# Patient Record
Sex: Female | Born: 1980 | Race: White | Hispanic: No | Marital: Single | Smoking: Current every day smoker
Health system: Southern US, Community
[De-identification: ages and names within clinical notes are randomized; demographics above are authoritative.]

## PROBLEM LIST (undated history)

## (undated) DIAGNOSIS — R21 Rash and other nonspecific skin eruption: Secondary | ICD-10-CM

## (undated) DIAGNOSIS — F341 Dysthymic disorder: Secondary | ICD-10-CM

## (undated) DIAGNOSIS — F411 Generalized anxiety disorder: Secondary | ICD-10-CM

## (undated) DIAGNOSIS — L408 Other psoriasis: Secondary | ICD-10-CM

## (undated) DIAGNOSIS — G4700 Insomnia, unspecified: Secondary | ICD-10-CM

## (undated) DIAGNOSIS — F329 Major depressive disorder, single episode, unspecified: Secondary | ICD-10-CM

## (undated) DIAGNOSIS — R569 Unspecified convulsions: Secondary | ICD-10-CM

## (undated) HISTORY — PX: POLYPECTOMY: SHX149

## (undated) HISTORY — DX: Dysthymic disorder: F34.1

## (undated) HISTORY — DX: Other psoriasis: L40.8

## (undated) HISTORY — DX: Generalized anxiety disorder: F41.1

## (undated) HISTORY — DX: Insomnia, unspecified: G47.00

## (undated) HISTORY — DX: Major depressive disorder, single episode, unspecified: F32.9

## (undated) HISTORY — DX: Rash and other nonspecific skin eruption: R21

## (undated) HISTORY — PX: OVARIAN CYST REMOVAL: SHX89

---
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
## 2001-01-10 ENCOUNTER — Emergency Department (HOSPITAL_COMMUNITY): Admission: EM | Admit: 2001-01-10 | Discharge: 2001-01-10 | Payer: Self-pay | Admitting: Emergency Medicine

## 2002-08-17 ENCOUNTER — Emergency Department (HOSPITAL_COMMUNITY): Admission: EM | Admit: 2002-08-17 | Discharge: 2002-08-17 | Payer: Self-pay | Admitting: Emergency Medicine

## 2003-11-14 ENCOUNTER — Emergency Department (HOSPITAL_COMMUNITY): Admission: EM | Admit: 2003-11-14 | Discharge: 2003-11-14 | Payer: Self-pay | Admitting: Emergency Medicine

## 2005-03-07 ENCOUNTER — Emergency Department (HOSPITAL_COMMUNITY): Admission: EM | Admit: 2005-03-07 | Discharge: 2005-03-07 | Payer: Self-pay | Admitting: Emergency Medicine

## 2005-03-22 ENCOUNTER — Emergency Department (HOSPITAL_COMMUNITY): Admission: EM | Admit: 2005-03-22 | Discharge: 2005-03-22 | Payer: Self-pay | Admitting: Emergency Medicine

## 2006-07-31 ENCOUNTER — Emergency Department (HOSPITAL_COMMUNITY): Admission: EM | Admit: 2006-07-31 | Discharge: 2006-07-31 | Payer: Self-pay | Admitting: Emergency Medicine

## 2006-11-14 ENCOUNTER — Emergency Department (HOSPITAL_COMMUNITY): Admission: EM | Admit: 2006-11-14 | Discharge: 2006-11-14 | Payer: Self-pay | Admitting: Emergency Medicine

## 2007-03-09 ENCOUNTER — Emergency Department (HOSPITAL_COMMUNITY): Admission: EM | Admit: 2007-03-09 | Discharge: 2007-03-09 | Payer: Self-pay | Admitting: Emergency Medicine

## 2007-04-09 ENCOUNTER — Emergency Department (HOSPITAL_COMMUNITY): Admission: EM | Admit: 2007-04-09 | Discharge: 2007-04-09 | Payer: Self-pay | Admitting: Emergency Medicine

## 2008-01-11 HISTORY — PX: OTHER SURGICAL HISTORY: SHX169

## 2008-02-27 LAB — CONVERTED CEMR LAB: Pap Smear: NORMAL

## 2008-03-24 ENCOUNTER — Ambulatory Visit: Payer: Self-pay | Admitting: Internal Medicine

## 2008-03-24 ENCOUNTER — Telehealth: Payer: Self-pay | Admitting: Internal Medicine

## 2008-03-24 DIAGNOSIS — G4700 Insomnia, unspecified: Secondary | ICD-10-CM | POA: Insufficient documentation

## 2008-03-24 DIAGNOSIS — L408 Other psoriasis: Secondary | ICD-10-CM

## 2008-03-24 DIAGNOSIS — F341 Dysthymic disorder: Secondary | ICD-10-CM

## 2008-03-24 HISTORY — DX: Dysthymic disorder: F34.1

## 2008-03-24 HISTORY — DX: Insomnia, unspecified: G47.00

## 2008-03-24 HISTORY — DX: Other psoriasis: L40.8

## 2008-05-20 ENCOUNTER — Telehealth (INDEPENDENT_AMBULATORY_CARE_PROVIDER_SITE_OTHER): Payer: Self-pay | Admitting: *Deleted

## 2008-06-15 ENCOUNTER — Emergency Department (HOSPITAL_COMMUNITY): Admission: EM | Admit: 2008-06-15 | Discharge: 2008-06-15 | Payer: Self-pay | Admitting: Emergency Medicine

## 2008-08-12 ENCOUNTER — Encounter (INDEPENDENT_AMBULATORY_CARE_PROVIDER_SITE_OTHER): Payer: Self-pay | Admitting: Obstetrics and Gynecology

## 2008-08-12 ENCOUNTER — Ambulatory Visit (HOSPITAL_COMMUNITY): Admission: RE | Admit: 2008-08-12 | Discharge: 2008-08-12 | Payer: Self-pay | Admitting: Obstetrics and Gynecology

## 2008-09-09 ENCOUNTER — Telehealth: Payer: Self-pay | Admitting: Internal Medicine

## 2008-11-14 ENCOUNTER — Emergency Department (HOSPITAL_COMMUNITY): Admission: EM | Admit: 2008-11-14 | Discharge: 2008-11-15 | Payer: Self-pay | Admitting: Emergency Medicine

## 2008-12-08 ENCOUNTER — Telehealth: Payer: Self-pay | Admitting: Internal Medicine

## 2009-03-02 ENCOUNTER — Telehealth: Payer: Self-pay | Admitting: Internal Medicine

## 2009-03-10 LAB — CONVERTED CEMR LAB: Pap Smear: NORMAL

## 2009-05-20 ENCOUNTER — Ambulatory Visit: Payer: Self-pay | Admitting: Internal Medicine

## 2009-05-20 DIAGNOSIS — F329 Major depressive disorder, single episode, unspecified: Secondary | ICD-10-CM

## 2009-05-20 DIAGNOSIS — F411 Generalized anxiety disorder: Secondary | ICD-10-CM | POA: Insufficient documentation

## 2009-05-20 DIAGNOSIS — F3289 Other specified depressive episodes: Secondary | ICD-10-CM

## 2009-05-20 HISTORY — DX: Other specified depressive episodes: F32.89

## 2009-05-20 HISTORY — DX: Major depressive disorder, single episode, unspecified: F32.9

## 2009-05-20 HISTORY — DX: Generalized anxiety disorder: F41.1

## 2009-08-13 ENCOUNTER — Ambulatory Visit: Payer: Self-pay | Admitting: Internal Medicine

## 2009-08-13 DIAGNOSIS — R21 Rash and other nonspecific skin eruption: Secondary | ICD-10-CM

## 2009-08-13 HISTORY — DX: Rash and other nonspecific skin eruption: R21

## 2009-09-07 ENCOUNTER — Telehealth: Payer: Self-pay | Admitting: Internal Medicine

## 2009-12-14 ENCOUNTER — Telehealth: Payer: Self-pay | Admitting: Internal Medicine

## 2010-02-09 NOTE — Progress Notes (Signed)
Summary: medication refill  Phone Note Refill Request Message from:  Fax from Pharmacy on December 14, 2009 10:35 AM  Refills Requested: Medication #1:  ALPRAZOLAM 0.5 MG TABS 1po two times a day as needed   Dosage confirmed as above?Dosage Confirmed   Last Refilled: 09/07/2009   Notes: [facility] [phone] Initial call taken by: Robin Ewing CMA Duncan Dull),  December 14, 2009 10:35 AM    Prescriptions: ALPRAZOLAM 0.5 MG TABS (ALPRAZOLAM) 1po two times a day as needed  #60 x 3   Entered and Authorized by:   Corwin Levins MD   Signed by:   Corwin Levins MD on 12/14/2009   Method used:   Print then Give to Patient   RxID:   [id number]  done hardcopy to LIM side B - dahlia Corwin Levins MD  December 14, 2009 1:02 PM   Rx faxed to pharmacy Margaret Pyle, CMA  December 14, 2009 1:11 PM

## 2010-02-09 NOTE — Assessment & Plan Note (Signed)
Summary: fu---stc   Vital Signs:  Patient profile:   30 year old female Height:      64 inches Weight:      153 pounds BMI:     26.36 O2 Sat:      96 % on Room air Temp:     98.0 degrees F oral Pulse rate:   71 / minute BP sitting:   118 / 78  (left arm) Cuff size:   regular  Vitals Entered By: Bill Salinas CMA (May 20, 2009 8:15 AM)  O2 Flow:  Room air  Preventive Care Screening  Pap Smear:    Date:  03/10/2009    Results:  normal   CC: pt here for follow up, she states she is no longer taking sertraline/ ab   CC:  pt here for follow up and she states she is no longer taking sertraline/ ab.  History of Present Illness: overall doing well,  did not tolerate the setraline so had to stop after 2 months;  alprazolam does well but has to take 2, only takes a few times per wk with panic symptoms or trouble sleeping;  Pt denies CP, sob, doe, wheezing, orthopnea, pnd, worsening LE edema, palps, dizziness or syncope  Pt denies new neuro symptoms such as headache, facial or extremity weakness   No worsening depressive symptoms or suicidal ideation currently, although has had mld symptoms in the past yr..  Does not think counseling will help at all.    Has yearly pap and blood work with GYN - all normal recently per pt.   Does request cream for psoriasis that has been mild but incrased recently - did well with clobetasol per derm last yr.    Problems Prior to Update: 1)  Depression  (ICD-311) 2)  Anxiety  (ICD-300.00) 3)  Insomnia-sleep Disorder-unspec  (ICD-780.52) 4)  Depression/anxiety  (ICD-300.4) 5)  Psoriasis  (ICD-696.1)  Medications Prior to Update: 1)  Alprazolam 0.25 Mg Tabs (Alprazolam) .Marland Kitchen.. 1 By Mouth Two Times A Day As Needed 2)  Sertraline Hcl 100 Mg Tabs (Sertraline Hcl) .Marland Kitchen.. 1 and 1/2 By Mouth Once Daily  Current Medications (verified): 1)  Alprazolam 0.5 Mg Tabs (Alprazolam) .Marland Kitchen.. 1po Two Times A Day As Needed 2)  Clobetasol Propionate 0.05 % Crea (Clobetasol  Propionate) .... Use Asd Two Times A Day As Needed  Allergies (verified): 1)  * Sertaline  Past History:  Family History: Last updated: 03/24/2008 grandfather with DJD, defibrillator, DM mother with DM, anxiety father with depression  Social History: Last updated: 03/24/2008 Single no children work - Engineer, manufacturing systems - bartender Current Smoker Alcohol use-yes - rare  Risk Factors: Smoking Status: current (03/24/2008)  Past Medical History: psoriasis Anxiety Depression  Past Surgical History: Denies surgical history s/p uterine polyp may 2010   Review of Systems       all otherwise negative per pt -    Physical Exam  General:  alert and overweight-appearing - mild Head:  normocephalic and atraumatic.   Eyes:  vision grossly intact, pupils equal, and pupils round.   Ears:  R ear normal and L ear normal.   Nose:  no external deformity and no nasal discharge.   Mouth:  no gingival abnormalities and pharynx pink and moist.   Neck:  supple and no masses.   Lungs:  normal respiratory effort and normal breath sounds.   Heart:  normal rate and regular rhythm.   Abdomen:  soft, non-tender, and normal bowel sounds.   Msk:  no joint tenderness and no joint swelling.   Extremities:  no edema, no erythema  Skin:  mild psoriatic plaque to elbows and leg below the knees Psych:  not depressed appearing and moderately anxious.     Impression & Recommendations:  Problem # 1:  PSORIASIS (ICD-696.1) ok for clobetasol as needed   Problem # 2:  ANXIETY (ICD-300.00)  The following medications were removed from the medication list:    Sertraline Hcl 100 Mg Tabs (Sertraline hcl) .Marland Kitchen... 1 and 1/2 by mouth once daily Her updated medication list for this problem includes:    Alprazolam 0.5 Mg Tabs (Alprazolam) .Marland Kitchen... 1po two times a day as needed treat as above, f/u any worsening signs or symptoms   Problem # 3:  DEPRESSION (ICD-311)  The following medications were removed from  the medication list:    Sertraline Hcl 100 Mg Tabs (Sertraline hcl) .Marland Kitchen... 1 and 1/2 by mouth once daily Her updated medication list for this problem includes:    Alprazolam 0.5 Mg Tabs (Alprazolam) .Marland Kitchen... 1po two times a day as needed consdier wellbutrin, but stable at this time  Complete Medication List: 1)  Alprazolam 0.5 Mg Tabs (Alprazolam) .Marland Kitchen.. 1po two times a day as needed 2)  Clobetasol Propionate 0.05 % Crea (Clobetasol propionate) .... Use asd two times a day as needed  Patient Instructions: 1)  Continue all previous medications as before this visit  2)  Please schedule a follow-up appointment in 1 year or sooner if needed Prescriptions: CLOBETASOL PROPIONATE 0.05 % CREA (CLOBETASOL PROPIONATE) use asd two times a day as needed  #60 gm x 1   Entered and Authorized by:   Corwin Levins MD   Signed by:   Corwin Levins MD on 05/20/2009   Method used:   Print then Give to Patient   RxID:   [id number] ALPRAZOLAM 0.5 MG TABS (ALPRAZOLAM) 1po two times a day as needed  #60 x 3   Entered and Authorized by:   Corwin Levins MD   Signed by:   Corwin Levins MD on 05/20/2009   Method used:   Print then Give to Patient   RxID:   [id number]

## 2010-02-09 NOTE — Assessment & Plan Note (Signed)
Summary: rash lower leg,ankle/cd   Vital Signs:  Patient profile:   30 year old female Height:      64 inches Weight:      141.50 pounds BMI:     24.38 O2 Sat:      96 % on Room air Temp:     98 degrees F oral Pulse rate:   87 / minute BP sitting:   90 / 62  (left arm) Cuff size:   regular  Vitals Entered By: Zella Ball Ewing CMA Duncan Dull) (August 13, 2009 9:57 AM)  O2 Flow:  Room air CC: Rash on lower right leg,ankle/RE   CC:  Rash on lower right leg and ankle/RE.  History of Present Illness: here after seen at urgent care recently for area of rash (not much  better with her clobetasol she has for long term tx of her chronic psoriasis)  to the RLE involving the lateral /post heel and ankle which she was told was c/w ringworm, tx with oral fluconozole and ketoconoz cream which did not seem to help last wk;  became frustrated so she tried clorox topical on her own which only seemed to make the area worse with redness, itch and worsening swelling distally to the ankle and foot;  there is only minimjal pain and everything just seems to itch more.  Pt denies CP, sob, doe, wheezing, orthopnea, pnd, worsening LE edema, palps, dizziness or syncope  No tongue swelling or wheezing/sob.  No fever, wt loss, night sweats, loss of appetite or other constitutional symptoms   Has also had recurrent gingival isse with bleeding, seen per dental with cleaning and tx with antibx briefly which seemed to help, then gradually worsening again in the past 2 -3 days.   No fever, facial or sinus pain, neck pain or lumps.   Has ongoing stressors at home, but denies worsening depressive symptoms, suicidal ideaiton, panic or worsening anxiety.    Problems Prior to Update: 1)  Depression  (ICD-311) 2)  Anxiety  (ICD-300.00) 3)  Insomnia-sleep Disorder-unspec  (ICD-780.52) 4)  Depression/anxiety  (ICD-300.4) 5)  Psoriasis  (ICD-696.1)  Medications Prior to Update: 1)  Alprazolam 0.5 Mg Tabs (Alprazolam) .Marland Kitchen.. 1po Two  Times A Day As Needed 2)  Clobetasol Propionate 0.05 % Crea (Clobetasol Propionate) .... Use Asd Two Times A Day As Needed  Current Medications (verified): 1)  Alprazolam 0.5 Mg Tabs (Alprazolam) .Marland Kitchen.. 1po Two Times A Day As Needed 2)  Clobetasol Propionate 0.05 % Crea (Clobetasol Propionate) .... Use Asd Two Times A Day As Needed 3)  Prednisone 10 Mg Tabs (Prednisone) .... 3po Qd For 3days, Then 2po Qd For 3days, Then 1po Qd For 3days, Then Stop 4)  Lotrisone 1-0.05 % Crea (Clotrimazole-Betamethasone) .... Use Asd Two Times A Day As Needed 5)  Clindamycin Hcl 150 Mg Caps (Clindamycin Hcl) .Marland Kitchen.. 1 By Mouth Three Times A Day  Allergies (verified): 1)  * Sertaline  Past History:  Past Medical History: Last updated: 05/20/2009 psoriasis Anxiety Depression  Past Surgical History: Last updated: 05/20/2009 Denies surgical history s/p uterine polyp may 2010   Social History: Last updated: 03/24/2008 Single no children work - Engineer, manufacturing systems - bartender Current Smoker Alcohol use-yes - rare  Risk Factors: Smoking Status: current (03/24/2008)  Review of Systems       all otherwise negative per pt -    Physical Exam  General:  alert and well-developed.   Head:  normocephalic and atraumatic.   Eyes:  vision grossly intact,  pupils equal, and pupils round.   Ears:  R ear normal and L ear normal.   Nose:  no external deformity and no nasal discharge.   Mouth:  no gingival abnormalities and pharynx pink and moist.  except for left mild erythema/tender/sweling and mild bleeding noted Neck:  supple and no masses.   Lungs:  normal respiratory effort and normal breath sounds.   Heart:  normal rate and regular rhythm.   Extremities:  no edema, no erythema  Skin:  right lateral ankle and heel with approx 5x5 cm area erytheama with scaliness and slihgt swelling, nontender, but with more distall foot swelling without tender or erythema as well;  also with numerous psoriatic lesions to the  extremities and abd area as well Psych:  not anxious appearing and not depressed appearing.     Impression & Recommendations:  Problem # 1:  SKIN RASH (ICD-782.1)  to right ankle/heel with flare related to clorox on the "ringworm" - c/w contact dermatitis  - for depo today, as well as lotrisone topical asd   Orders: Depo- Medrol 40mg  (J1030) Depo- Medrol 80mg  (J1040) Admin of Therapeutic Inj  intramuscular or subcutaneous (45409) Dermatology Referral (Derma)  Her updated medication list for this problem includes:    Clobetasol Propionate 0.05 % Crea (Clobetasol propionate) ..... Use asd two times a day as needed    Lotrisone 1-0.05 % Crea (Clotrimazole-betamethasone) ..... Use asd two times a day as needed  Problem # 2:  PSORIASIS (ICD-696.1) also for clobetasol to cont long term as needed  Orders: Depo- Medrol 40mg  (J1030) Depo- Medrol 80mg  (J1040) Admin of Therapeutic Inj  intramuscular or subcutaneous (81191) Dermatology Referral (Derma)  Problem # 3:  DEPRESSION/ANXIETY (ICD-300.4) stable overall by hx and exam, ok to continue meds/tx as is - declines need for med trial such as ssri  Complete Medication List: 1)  Alprazolam 0.5 Mg Tabs (Alprazolam) .Marland Kitchen.. 1po two times a day as needed 2)  Clobetasol Propionate 0.05 % Crea (Clobetasol propionate) .... Use asd two times a day as needed 3)  Prednisone 10 Mg Tabs (Prednisone) .... 3po qd for 3days, then 2po qd for 3days, then 1po qd for 3days, then stop 4)  Lotrisone 1-0.05 % Crea (Clotrimazole-betamethasone) .... Use asd two times a day as needed 5)  Clindamycin Hcl 150 Mg Caps (Clindamycin hcl) .Marland Kitchen.. 1 by mouth three times a day  Patient Instructions: 1)  you had the steroid shot today 2)  Please take all new medications as prescribed - the prednisone, and the generic lotrisone cream until the rash is healed; as well as the cleocin for the mouth infection  3)  Continue all previous medications as before this visit , except you  can stop the ketoconozole cream , and diflucan 4)  You can continue the clobetasol long term for the psoriasis 5)  You will be contacted about the referral(s) to: Dermatology 6)  Please schedule a follow-up appointment as needed. Prescriptions: CLINDAMYCIN HCL 150 MG CAPS (CLINDAMYCIN HCL) 1 by mouth three times a day  #10 x 0   Entered and Authorized by:   Corwin Levins MD   Signed by:   Corwin Levins MD on 08/13/2009   Method used:   Print then Give to Patient   RxID:   [id number] LOTRISONE 1-0.05 % CREA (CLOTRIMAZOLE-BETAMETHASONE) use asd two times a day as needed  #1 x 1   Entered and Authorized by:   Corwin Levins MD   Signed by:  Corwin Levins MD on 08/13/2009   Method used:   Print then Give to Patient   RxID:   [id number] PREDNISONE 10 MG TABS (PREDNISONE) 3po qd for 3days, then 2po qd for 3days, then 1po qd for 3days, then stop  #18 x 0   Entered and Authorized by:   Corwin Levins MD   Signed by:   Corwin Levins MD on 08/13/2009   Method used:   Print then Give to Patient   RxID:   [id number]    Medication Administration  Injection # 1:    Medication: Depo- Medrol 40mg     Diagnosis: PSORIASIS (ICD-696.1)    Route: IM    Site: LUOQ gluteus    Exp Date: 04/2012    Lot #: [id number]    Mfr: Pharmacia    Comments: Patient received 120mg  Depo-medrol    Patient tolerated injection without complications    Given by: Zella Ball Ewing CMA (AAMA) (August 13, 2009 11:05 AM)  Injection # 2:    Medication: Depo- Medrol 80mg     Diagnosis: PSORIASIS (ICD-696.1)    Route: IM    Site: LUOQ gluteus    Exp Date: 04/2012    Lot #: [id number]    Mfr: Pharmacia    Given by: Zella Ball Ewing CMA (AAMA) (August 13, 2009 11:05 AM)  Orders Added: 1)  Depo- Medrol 40mg  [J1030] 2)  Depo- Medrol 80mg  [J1040] 3)  Admin of Therapeutic Inj  intramuscular or subcutaneous [96372] 4)  Dermatology Referral [Derma] 5)  Est. Patient Level IV [84696]

## 2010-02-09 NOTE — Progress Notes (Signed)
Summary: Alprazolam  Phone Note Refill Request Message from:  Fax from Pharmacy on March 02, 2009 9:20 AM  Refills Requested: Medication #1:  ALPRAZOLAM 0.25 MG TABS 1 by mouth two times a day as needed Next Appointment Scheduled: none Initial call taken by: Lucious Groves,  March 02, 2009 9:20 AM  Follow-up for Phone Call        may give #60 with 1 refill - needs to make OV before any other refills given  - thanks Follow-up by: Newt Lukes MD,  March 02, 2009 9:34 AM    Prescriptions: ALPRAZOLAM 0.25 MG TABS (ALPRAZOLAM) 1 by mouth two times a day as needed  #60 x 1   Entered by:   Lucious Groves   Authorized by:   Newt Lukes MD   Signed by:   Lucious Groves on 03/02/2009   Method used:   Telephoned to ...       [facility]  [phone]* (retail)       [address] [place], [place]  [address]       Ph: [phone] or [phone]       Fax: [phone]   RxID:   [id number]

## 2010-02-09 NOTE — Progress Notes (Signed)
Summary: medication refill  Phone Note Refill Request Message from:  Fax from Pharmacy on September 07, 2009 10:29 AM  Refills Requested: Medication #1:  ALPRAZOLAM 0.5 MG TABS 1po two times a day as needed   Dosage confirmed as above?Dosage Confirmed   Last Refilled: 05/20/2009   Notes: [facility] [phone] Initial call taken by: Robin Ewing CMA Duncan Dull),  September 07, 2009 10:30 AM    Prescriptions: ALPRAZOLAM 0.5 MG TABS (ALPRAZOLAM) 1po two times a day as needed  #60 x 3   Entered and Authorized by:   Corwin Levins MD   Signed by:   Corwin Levins MD on 09/07/2009   Method used:   Print then Give to Patient   RxID:   [id number]  done hardcopy to LIM side B - dahlia Corwin Levins MD  September 07, 2009 1:27 PM   Appended Document: medication refill faxed hardcopy to pharmacy

## 2010-04-05 ENCOUNTER — Other Ambulatory Visit: Payer: Self-pay | Admitting: Obstetrics and Gynecology

## 2010-04-14 LAB — COMPREHENSIVE METABOLIC PANEL
ALT: 22 U/L (ref 0–35)
AST: 27 U/L (ref 0–37)
Albumin: 4.3 g/dL (ref 3.5–5.2)
Alkaline Phosphatase: 69 U/L (ref 39–117)
BUN: 10 mg/dL (ref 6–23)
CO2: 24 mEq/L (ref 19–32)
Calcium: 8.6 mg/dL (ref 8.4–10.5)
Chloride: 106 mEq/L (ref 96–112)
Creatinine, Ser: 0.65 mg/dL (ref 0.4–1.2)
GFR calc Af Amer: >60 mL/min (ref >60)
GFR calc non Af Amer: >60 mL/min (ref >60)
Glucose, Bld: 82 mg/dL (ref 70–99)
Potassium: 4 mEq/L (ref 3.5–5.1)
Sodium: 139 mEq/L (ref 135–145)
Total Bilirubin: 0.5 mg/dL (ref 0.3–1.2)
Total Protein: 7 g/dL (ref 6.0–8.3)

## 2010-04-14 LAB — URINALYSIS, ROUTINE W REFLEX MICROSCOPIC
Bilirubin Urine: NEGATIVE
Glucose, UA: NEGATIVE mg/dL
Hgb urine dipstick: NEGATIVE
Ketones, ur: NEGATIVE mg/dL
Nitrite: NEGATIVE
Protein, ur: NEGATIVE mg/dL
Specific Gravity, Urine: 1.008 (ref 1.005–1.030)
Urobilinogen, UA: 0.2 mg/dL (ref 0.0–1.0)
pH: 7 (ref 5.0–8.0)

## 2010-04-14 LAB — CBC
HCT: 43.5 % (ref 36.0–46.0)
Hemoglobin: 14.9 g/dL (ref 12.0–15.0)
MCHC: 34.2 g/dL (ref 30.0–36.0)
MCV: 95.4 fL (ref 78.0–100.0)
Platelets: 326 10*3/uL (ref 150–400)
RBC: 4.55 MIL/uL (ref 3.87–5.11)
RDW: 12.3 % (ref 11.5–15.5)
WBC: 10.8 10*3/uL — ABNORMAL HIGH (ref 4.0–10.5)

## 2010-04-14 LAB — DIFFERENTIAL
Basophils Absolute: 0.2 10*3/uL — ABNORMAL HIGH (ref 0.0–0.1)
Basophils Relative: 2 % — ABNORMAL HIGH (ref 0–1)
Eosinophils Absolute: 0.1 10*3/uL (ref 0.0–0.7)
Eosinophils Relative: 1 % (ref 0–5)
Lymphocytes Relative: 22 % (ref 12–46)
Lymphs Abs: 2.4 10*3/uL (ref 0.7–4.0)
Monocytes Absolute: 0.6 10*3/uL (ref 0.1–1.0)
Monocytes Relative: 5 % (ref 3–12)
Neutro Abs: 7.4 10*3/uL (ref 1.7–7.7)
Neutrophils Relative %: 70 % (ref 43–77)

## 2010-04-14 LAB — PREGNANCY, URINE: Preg Test, Ur: NEGATIVE

## 2010-04-14 LAB — RAPID URINE DRUG SCREEN, HOSP PERFORMED
Amphetamines: NOT DETECTED
Barbiturates: NOT DETECTED
Benzodiazepines: POSITIVE — AB
Cocaine: NOT DETECTED
Opiates: NOT DETECTED
Tetrahydrocannabinol: POSITIVE — AB

## 2010-04-14 LAB — ACETAMINOPHEN LEVEL: Acetaminophen (Tylenol), Serum: <10 ug/mL — ABNORMAL LOW (ref 10–30)

## 2010-04-14 LAB — ETHANOL: Alcohol, Ethyl (B): 177 mg/dL — ABNORMAL HIGH (ref 0–10)

## 2010-04-14 LAB — SALICYLATE LEVEL: Salicylate Lvl: <4 mg/dL (ref 2.8–20.0)

## 2010-04-17 LAB — CBC
HCT: 41.3 % (ref 36.0–46.0)
Hemoglobin: 14.1 g/dL (ref 12.0–15.0)
MCHC: 34.2 g/dL (ref 30.0–36.0)
MCV: 96.8 fL (ref 78.0–100.0)
Platelets: 234 10*3/uL (ref 150–400)
RBC: 4.27 MIL/uL (ref 3.87–5.11)
RDW: 12.9 % (ref 11.5–15.5)
WBC: 10.3 10*3/uL (ref 4.0–10.5)

## 2010-04-17 LAB — HCG, QUANTITATIVE, PREGNANCY: hCG, Beta Chain, Quant, S: <2 m[IU]/mL (ref <5)

## 2010-04-19 LAB — URINALYSIS, ROUTINE W REFLEX MICROSCOPIC
Glucose, UA: NEGATIVE mg/dL
Ketones, ur: NEGATIVE mg/dL
Leukocytes, UA: NEGATIVE
Protein, ur: NEGATIVE mg/dL

## 2010-04-19 LAB — URINE CULTURE: Colony Count: NO GROWTH

## 2010-04-19 LAB — CBC
HCT: 41.4 % (ref 36.0–46.0)
Hemoglobin: 14.2 g/dL (ref 12.0–15.0)
MCHC: 34.2 g/dL (ref 30.0–36.0)
MCV: 93.6 fL (ref 78.0–100.0)
Platelets: 241 10*3/uL (ref 150–400)
RBC: 4.43 MIL/uL (ref 3.87–5.11)
RDW: 13 % (ref 11.5–15.5)
WBC: 8.5 10*3/uL (ref 4.0–10.5)

## 2010-04-19 LAB — DIFFERENTIAL
Basophils Absolute: 0 10*3/uL (ref 0.0–0.1)
Basophils Relative: 1 % (ref 0–1)
Eosinophils Absolute: 0.1 10*3/uL (ref 0.0–0.7)
Eosinophils Relative: 1 % (ref 0–5)
Lymphocytes Relative: 30 % (ref 12–46)
Lymphs Abs: 2.5 10*3/uL (ref 0.7–4.0)
Monocytes Absolute: 0.5 10*3/uL (ref 0.1–1.0)
Monocytes Relative: 6 % (ref 3–12)
Neutro Abs: 5.3 10*3/uL (ref 1.7–7.7)
Neutrophils Relative %: 63 % (ref 43–77)

## 2010-04-19 LAB — POCT I-STAT, CHEM 8
BUN: 5 mg/dL — ABNORMAL LOW (ref 6–23)
Calcium, Ion: 1.08 mmol/L — ABNORMAL LOW (ref 1.12–1.32)
Chloride: 104 mEq/L (ref 96–112)
Creatinine, Ser: 0.6 mg/dL (ref 0.4–1.2)
Glucose, Bld: 164 mg/dL — ABNORMAL HIGH (ref 70–99)
HCT: 42 % (ref 36.0–46.0)
Hemoglobin: 14.3 g/dL (ref 12.0–15.0)
Potassium: 3.9 mEq/L (ref 3.5–5.1)
Sodium: 138 mEq/L (ref 135–145)
TCO2: 23 mmol/L (ref 0–100)

## 2010-04-19 LAB — WET PREP, GENITAL
Clue Cells Wet Prep HPF POC: NONE SEEN
Yeast Wet Prep HPF POC: NONE SEEN

## 2010-04-19 LAB — GC/CHLAMYDIA PROBE AMP, GENITAL: GC Probe Amp, Genital: NEGATIVE

## 2010-04-19 LAB — POCT PREGNANCY, URINE: Preg Test, Ur: NEGATIVE

## 2010-04-28 ENCOUNTER — Other Ambulatory Visit: Payer: Self-pay

## 2010-04-28 MED ORDER — ALPRAZOLAM 0.5 MG PO TABS: 0.5000 mg | ORAL_TABLET | Freq: Two times a day (BID) | ORAL | Status: AC | PRN

## 2010-04-28 NOTE — Telephone Encounter (Signed)
Done hardcopy to dahlia/LIM B  

## 2010-04-28 NOTE — Telephone Encounter (Signed)
Rx faxed to pharmacy  

## 2010-04-28 NOTE — Telephone Encounter (Signed)
[facility] requesting refill on Alprazolam 0.5 mg last refill 12/14/09 #60 with 3 refills and last OV 08/13/09.

## 2010-05-25 NOTE — H&P (Signed)
NAMECLEMENTINA, MARENO          ACCOUNT NO.:  [id number]   MEDICAL RECORD NO.:  [id number]          PATIENT TYPE:  AMB   LOCATION:  [facility]                           FACILITY:  [facility]   PHYSICIAN:  Naima A. Dillard, M.D. DATE OF BIRTH:  02/24/1980   DATE OF ADMISSION:  DATE OF DISCHARGE:                              HISTORY & PHYSICAL   HISTORY OF PRESENT ILLNESS:  Ms. Skillin is a 30 year old single white  female gravida 0, presenting for a diagnostic laparoscopy, hysteroscopy,  D and C with polypectomy because of chronic pelvic pain, irregular  vaginal bleeding and an endometrial polyp.  Since April 2010, the  patient reports spotting throughout the entire month along with her  monthly menstrual flow which lasts approximately 4 days.  Since that  time, she has had with this spotting constant cramping on a daily basis  which she rates as a 6-8/10 on a 10-point pain scale.  The patient has  not found any relief from this cramping with over-the-counter analgesia  nor with narcotic (Darvocet) pain medication.  She denies any urinary  tract symptoms, any changes in her bowel habits, vaginitis symptoms or  fever.  She does admit, however, to episodes of diarrhea which have been  going on for an extended period of time, during which time she will pass  4 loose stools 4 times a day whenever these episodes occur.  The  pelvic ultrasound done on July 09, 2008, showed a uterus measuring 6.89  cm x 3.95 cm x 2.99 cm with a hyperechoic mass in the endometrium  measuring 0.84 x 0.57 with positive single blood flow with color flow  Doppler suggestive of a polyp.  The patient's ovaries appeared within  normal range on this study.  Given patient's symptoms of irregular  bleeding and chronic pelvic pain, she has decided to proceed with the  aforementioned procedures.   PAST MEDICAL HISTORY:   OBSTETRIC HISTORY:  Gravida 0.   GYNECOLOGIC HISTORY:  Menarche 30 years old.  Last menstrual period, see  history of present illness.  The patient does not use any method of  contraception.  Denies any history of sexually transmitted diseases, has  a history of an abnormal Pap smear in 2004, for which she underwent  colposcopy (results are not available).  The patient had a normal Pap,  however, in February 2010.   MEDICAL HISTORY:  Positive for psoriasis, anxiety, depression and  asthma.   SURGERY:  Negative.  Denies any history of blood transfusion.   FAMILY HISTORY:  Positive for anxiety, cardiovascular disease, diabetes,  and skin cancer.   HABITS:  The patient smokes one-half pack of cigarettes per day,  occasionally consumes alcohol, denies illicit drug use.   SOCIAL HISTORY:  The patient is single and she works as a Production assistant, radio at  [employer].   CURRENT MEDICATIONS:  1. Alprazolam 0.25 mg daily as needed.  2. Sertraline 100 mg daily.   ALLERGIES:  The patient has no known drug allergies.   REVIEW OF SYSTEMS:  The patient denies any chest pain, shortness of  breath, headache, vision changes, nausea, vomiting, joint swelling or  pain and except as is mentioned in history of present illness, the  patient's review of systems is otherwise negative.   PHYSICAL EXAMINATION:  VITAL SIGNS:  Blood pressure is 110/80, weight  138 pounds, height 5 feet 4-1/2 inches tall.  EARS, NOSE AND THROAT:  Pupils are equal.  Hearing normal.  Throat is  clear.  NECK:  Supple without masses.  There is no thyromegaly or cervical  adenopathy.  HEART:  Regular rate and rhythm.  LUNGS:  Clear.  BACK:  No CVA tenderness.  ABDOMEN:  No tenderness, masses or organomegaly.  EXTREMITIES:  No clubbing, cyanosis or edema.  PELVIC:  EGBUS is normal.  Vagina is normal.  Cervix is nontender  without lesions.  Uterus appears normal size, shape and consistency;  however, there is mild tenderness.  Adnexa, no tenderness or masses.   IMPRESSION:  1. Chronic pelvic pain.  2. Irregular vaginal bleeding.  3.  Endometrial polyps.   DISPOSITION:  A discussion was held with the patient regarding the  indications for her procedures along with their risks which include, but  are not limited to reaction to anesthesia, damage to adjacent organs,  infection and excessive bleeding.  The patient verbalized understanding  of these risks and has consented to proceed with a diagnostic  laparoscopy, hysteroscopy with D and C/polypectomy at [facility] of  [place] on August 12, 2008, at 11 o'clock a.m.      [place] J. Adline Peals.      Naima A. Normand Sloop, M.D.  Electronically Signed    EJP/MEDQ  D:  08/04/2008  T:  08/05/2008  Job:  [id number]

## 2010-05-25 NOTE — Op Note (Signed)
NAMEIDONA, STACH          ACCOUNT NO.:  [id number]   MEDICAL RECORD NO.:  [id number]          PATIENT TYPE:  AMB   LOCATION:  SDC                           FACILITY:  [facility]   PHYSICIAN:  Naima A. Dillard, M.D. DATE OF BIRTH:  1980-03-19   DATE OF PROCEDURE:  08/12/2008  DATE OF DISCHARGE:  08/12/2008                               OPERATIVE REPORT   PREOPERATIVE DIAGNOSES:  Chronic pelvic pain, irregular vaginal  bleeding, endometrial polyps.   POSTOPERATIVE DIAGNOSES:  Chronic pelvic pain, irregular vaginal  bleeding, endometrial polyps with a right ovarian cysts.   PROCEDURE:  Hysteroscopy, dilation and curettage with resection of  endometrial polyp, diagnostic laparoscopy with ovarian cystectomy.   SURGEON:  Naima A. Normand Sloop, MD   ASSISTANT:  Marquis Lunch. Lowell Guitar, PA.   ANESTHESIA:  General.   FINDINGS:  A 2-3 endometrial polyps in the anterior uterine fundus.  No  submucosal fibroids were seen.  Both ostia were visualized.  Both polyps  and endometrial curettings was sent to pathology.   FINDINGS:  On the laparoscopy, there was about 2-3 cm size right ovarian  cyst, normal-appearing uterus, tubes, anterior and posterior cul-de-sac,  normal-appearing abdominal anatomy as bowel and liver.   ESTIMATED BLOOD LOSS:  Minimal.   IV FLUIDS:  1400 mL.   URINE OUTPUT:  200 mL.  The patient went to PACU in stable condition.   PROCEDURE IN DETAIL:  The patient was taken to the operating room where  she was given general anesthesia placed in dorsal lithotomy position and  prepped and draped in a normal sterile fashion.  A bivalve speculum was  placed into the vagina.  Anterior lip of the cervix was grasped with  single-tooth tenaculum.  Cervix was dilated with Pratt dilators to 21.  The diagnostic hysteroscope was placed into the uterine cavity and 2  polyps were seen in the anterior uterine wall.  The hysteroscope was  removed.  The cervix was further dilated with Pratt  dilators up to 32.  The resectoscope was placed into the uterine cavity and actually the  forceps was placed up with the polyp forceps and it some polyps were  removed with polyp forceps.  Once polyp was removed with the polyp  forceps and another just remained so that was resected with a  resectoscope.  Then, a sharp curettage was done.  Endometrial curette  and polyps was sent to pathology.  To look back in with a diagnostic  scope both polyps had been sessile removed.  A acorn manipulator was  then attached to the cervix and was placed in the cervical canal for  uterine manipulator.  Attention was then turned to the abdomen are  closed with change and 2-mm infraumbilical incision was made with the  scalpel and carried down to the fascia using the knife.  The fascia was  then incised in the midline and extended bilaterally using Mayo  scissors.  Peritoneum was identified, tented up, and sharply with  Metzenbaum scissors.  Hasson pursestring suture was placed around the  fascia.  The Hasson was placed and anchored to the pursestring suture.  The abdomen  was insufflated with CO2 gas.  A polyp as noted above was  seen.  Two left lower quadrant port and right lower quadrant port was  placed with using Marcaine first under direct visualization of  laparoscope.  The patient's right ovary was then grasped and lifted up.  The bipolar needle was used to dissect along the cyst wall.  The cyst  was then opened.  The cyst wall was removed and the bleeding areas were  made hemostatic with Kleppingers.  irrigation was then done and the  abdomen and cyst and the CO2 gas was allowed to leave the abdomen twice  and hemostasis was noted.  The trocars were removed under direct  visualization without any bleeding.  The umbilical trocar was removed  under direct visualization.  The pursestring suture was tied to  approximate the fascia.  The infraumbilical incision was closed with 0  Monocryl in a  subcuticular fashion.  The two right and left lower  quadrant ports were made hemostatic with Dermabond.  Foley catheter was  placed before we start the laparoscopy.  Both the Foley catheter and the  tenaculum and the acorn was removed.  Hemostasis was noted.  Sponge,  lap, and needle counts were correct.  The patient taken to recovery room  in stable condition.      Naima A. Normand Sloop, M.D.  Electronically Signed     NAD/MEDQ  D:  08/12/2008  T:  08/13/2008  Job:  [id number]

## 2010-05-28 NOTE — Discharge Summary (Signed)
[facility]  Patient:    Katelyn Bridges, Katelyn Bridges                 MRN: [id number] Adm. Date:  [id number] Disc. Date: [id number] Attending:  Odie Sera                           Discharge Summary  HISTORY OF PRESENT ILLNESS:  [name] is a young lady who is only 34.  She had an argument with her mother and made some statements about harming herself and as hospitalized here.  When evaluated, she did acknowledge marijuana abuse.  Urine drug screen was positive only for Benadryl and cocaine.  At any rate, the patient was evaluated by myself and Eduard Roux, nurse practitioner, and found not be dangerous or suicidal and able to contract for safety. So, she was discharged home with family session to be scheduled as well as IOP program to further address social issues.  DISCHARGE INSTRUCTIONS:  As mentioned, go to IOP and have family session.  FINAL DIAGNOSES:   AXIS I:  1. Major depression, recurrent, nonsuicidal.            2. Marijuana abuse.            3. Rule out cocaine abuse.  AXIS II:  Deferred. AXIS III:  Deferred.DD:  05/10/99 TD:  05/09/99 Job: [id number]

## 2010-06-14 ENCOUNTER — Encounter: Payer: Self-pay | Admitting: Internal Medicine

## 2010-06-14 DIAGNOSIS — Z0000 Encounter for general adult medical examination without abnormal findings: Secondary | ICD-10-CM | POA: Insufficient documentation

## 2010-06-15 ENCOUNTER — Other Ambulatory Visit (INDEPENDENT_AMBULATORY_CARE_PROVIDER_SITE_OTHER): Payer: BC Managed Care – PPO

## 2010-06-15 ENCOUNTER — Encounter: Payer: Self-pay | Admitting: Internal Medicine

## 2010-06-15 ENCOUNTER — Ambulatory Visit (INDEPENDENT_AMBULATORY_CARE_PROVIDER_SITE_OTHER): Payer: BC Managed Care – PPO | Admitting: Internal Medicine

## 2010-06-15 VITALS — BP 120/82 | HR 78 | Temp 97.6°F | Ht 65.0 in | Wt 138.0 lb

## 2010-06-15 DIAGNOSIS — R21 Rash and other nonspecific skin eruption: Secondary | ICD-10-CM

## 2010-06-15 DIAGNOSIS — R197 Diarrhea, unspecified: Secondary | ICD-10-CM

## 2010-06-15 DIAGNOSIS — F411 Generalized anxiety disorder: Secondary | ICD-10-CM

## 2010-06-15 LAB — CBC WITH DIFFERENTIAL/PLATELET
Basophils Absolute: 0 10*3/uL (ref 0.0–0.1)
Basophils Relative: 0.6 % (ref 0.0–3.0)
Eosinophils Absolute: 0.2 10*3/uL (ref 0.0–0.7)
Lymphocytes Relative: 41.8 % (ref 12.0–46.0)
MCHC: 34.4 g/dL (ref 30.0–36.0)
Neutrophils Relative %: 47.2 % (ref 43.0–77.0)
Platelets: 274 10*3/uL (ref 150.0–400.0)
RBC: 4.19 Mil/uL (ref 3.87–5.11)
RDW: 13.6 % (ref 11.5–14.6)

## 2010-06-15 LAB — BASIC METABOLIC PANEL
BUN: 8 mg/dL (ref 6–23)
Chloride: 110 mEq/L (ref 96–112)
Glucose, Bld: 92 mg/dL (ref 70–99)
Potassium: 4.2 mEq/L (ref 3.5–5.1)
Sodium: 143 mEq/L (ref 135–145)

## 2010-06-15 LAB — TSH: TSH: 1.63 u[IU]/mL (ref 0.35–5.50)

## 2010-06-15 LAB — HEPATIC FUNCTION PANEL
Bilirubin, Direct: 0.1 mg/dL (ref 0.0–0.3)
Total Bilirubin: 0.4 mg/dL (ref 0.3–1.2)

## 2010-06-15 MED ORDER — ADALIMUMAB 40 MG/0.8ML ~~LOC~~ KIT: 40.0000 mg | PACK | SUBCUTANEOUS | Status: DC

## 2010-06-15 MED ORDER — TRIAMCINOLONE ACETONIDE 0.1 % EX CREA: TOPICAL_CREAM | Freq: Two times a day (BID) | CUTANEOUS | Status: DC

## 2010-06-15 NOTE — Progress Notes (Signed)
Quick Note:  Voice message left on PhoneTree system - lab is negative, normal or otherwise stable, pt to continue same tx ______ 

## 2010-06-15 NOTE — Patient Instructions (Signed)
Take all new medications as prescribed - the cream Continue all other medications as before Please go to LAB in the Basement for the blood and/or urine tests to be done today (and stool tests) Please call the phone number [phone] (the [employer]Tree System) for results of testing in 2-3 days;  When calling, simply dial the number, and when prompted enter the MRN number above (the Medical Record Number) and the # key, then the message should start. You may wish to followup with your dermatologist if the rash persists, with the idea of Humira causing the rash, or for skin biopsy You may also wish to see GI if the diarrhea persists as well;  Please call if fever, pain or blood

## 2010-06-15 NOTE — Assessment & Plan Note (Signed)
Exam benign, afeb, check wbc, cont humira for now, check stool cultures,  to f/u any worsening symptoms or concerns

## 2010-06-15 NOTE — Progress Notes (Signed)
Subjective:    Patient ID: Katelyn Bridges, female    DOB: 1980-02-23, 30 y.o.   MRN: [id number]  HPI30 yo F with psoriasis on humira per derm, here with 1 wk hx of nonpruritic rash to legs below the knees, no pain, and otherwise functinally normal;  No fever, sweling other rash or redness, red streaks or hx of same in the past, but does have a sort of mild burning warm discomfort.  Has been on humira some time without rash or allergy.  HIV tested neg march 2012 per pt.  On no new meds or OTC's.  No overt bleeding or bruising;  Has also had unsual recurrent watery and loose stools almost dialy for approx 4 wks.  Denies worsening reflux, dysphagia, abd pain, n/v, other bowel change or blood.  Pt denies fever, wt loss, night sweats, loss of appetite, or other constitutional symptoms  Pt denies chest pain, increased sob or doe, wheezing, orthopnea, PND, increased LE swelling, palpitations, dizziness or syncope.  Pt denies new neurological symptoms such as new headache, or facial or extremity weakness or numbness   Pt denies polydipsia, polyuria. Past Medical History  Diagnosis Date  . ANXIETY 05/20/2009  . DEPRESSION/ANXIETY 03/24/2008  . DEPRESSION 05/20/2009  . INSOMNIA-SLEEP DISORDER-UNSPEC 03/24/2008  . PSORIASIS 03/24/2008  . SKIN RASH 08/13/2009   Past Surgical History  Procedure Date  . S/p uterine polyp 2010    reports that she has been smoking.  She does not have any smokeless tobacco history on file. She reports that she drinks alcohol. Her drug history not on file. family history includes Anxiety disorder in her mother; Depression in her father; and Diabetes in her mother and other. No Known Allergies Current Outpatient Prescriptions on File Prior to Visit  Medication Sig Dispense Refill  . ALPRAZolam (XANAX) 0.5 MG tablet Take 0.5 mg by mouth 2 (two) times daily as needed.        . clotrimazole-betamethasone (LOTRISONE) cream Apply topically 2 (two) times daily.        Marland Kitchen DISCONTD:  clobetasol (TEMOVATE) 0.05 % cream Apply topically 2 (two) times daily.        Marland Kitchen DISCONTD: clindamycin (CLEOCIN) 150 MG capsule Take 150 mg by mouth 3 (three) times daily.        Marland Kitchen DISCONTD: predniSONE (DELTASONE) 10 MG tablet 3 by mouth every day for 3 days then 2 by mouth every day for 3 days then 1 by mouth for 3 days then stop.        Review of Systems All otherwise neg per pt     Objective:   Physical Exam BP 120/82  Pulse 78  Temp(Src) 97.6 F (36.4 C) (Oral)  Ht 5\' 5"  (1.651 m)  Wt 138 lb (62.596 kg)  BMI 22.96 kg/m2  SpO2 95%  LMP 06/15/2010 Physical Exam  VS noted Constitutional: Pt appears well-developed and well-nourished.  HENT: Head: Normocephalic.  Right Ear: External ear normal.  Left Ear: External ear normal.  Eyes: Conjunctivae and EOM are normal. Pupils are equal, round, and reactive to light.  Neck: Normal range of motion. Neck supple.  Cardiovascular: Normal rate and regular rhythm.   Pulmonary/Chest: Effort normal and breath sounds normal.  Abd:  Soft, NT, non-distended, + BS Neurological: Pt is alert. No cranial nerve deficit.  Skin: Skin is warm. No erythema. except for diffuse TNTC small red lesions nontender, nonprurutic non swollen to legs below the knees to ankles Psychiatric: Pt behavior is normal. Thought content normal.  Assessment & Plan:

## 2010-06-15 NOTE — Assessment & Plan Note (Signed)
Etiology unclear - for esr, anca  , consider derm referral for biopsy, for now tx with topical triam cream prn trial

## 2010-06-15 NOTE — Assessment & Plan Note (Signed)
stable overall by hx and exam, most recent data reviewed with pt, and pt to continue medical treatment as before  Lab Results  Component Value Date   WBC 8.5 06/15/2010   HGB 13.9 06/15/2010   HCT 40.4 06/15/2010   PLT 274.0 06/15/2010   ALT 22 06/15/2010   AST 25 06/15/2010   NA 143 06/15/2010   K 4.2 06/15/2010   CL 110 06/15/2010   CREATININE 0.5 06/15/2010   BUN 8 06/15/2010   CO2 26 06/15/2010   TSH 1.63 06/15/2010

## 2010-08-23 ENCOUNTER — Other Ambulatory Visit: Payer: Self-pay

## 2010-08-23 MED ORDER — ALPRAZOLAM 0.5 MG PO TABS: 0.5000 mg | ORAL_TABLET | Freq: Two times a day (BID) | ORAL | Status: DC | PRN

## 2010-08-23 NOTE — Telephone Encounter (Signed)
Faxed hardcopy to [facility] [phone]

## 2010-08-24 ENCOUNTER — Other Ambulatory Visit: Payer: Self-pay | Admitting: *Deleted

## 2010-08-24 MED ORDER — ALPRAZOLAM 0.5 MG PO TABS: 0.5000 mg | ORAL_TABLET | Freq: Two times a day (BID) | ORAL | Status: DC | PRN

## 2010-08-24 NOTE — Telephone Encounter (Signed)
The pt is correct  Please cancel initial rx  Corrected rx is Done hardcopy to d[facility]ia/[facility] B

## 2010-08-24 NOTE — Telephone Encounter (Signed)
Pt is calling regarding Xanax refill. She states that her prescription was for a quantity of 30 and she takes 1 tablet bid. Pt states that she also has been seen in the office in June and wants to know if she needs to make another office visit for refills (per rx)-please advise.

## 2010-08-25 NOTE — Telephone Encounter (Signed)
Pharmacy advised and will inform pt of same.

## 2010-10-01 LAB — DIFFERENTIAL
Basophils Absolute: 0
Basophils Relative: 0
Eosinophils Absolute: 0
Eosinophils Relative: 0
Lymphocytes Relative: 18
Monocytes Absolute: 0.8

## 2010-10-01 LAB — COMPREHENSIVE METABOLIC PANEL
AST: 26
Albumin: 4.2
Alkaline Phosphatase: 58
CO2: 22
Chloride: 108
Creatinine, Ser: 0.51
GFR calc Af Amer: >60
GFR calc non Af Amer: >60
Potassium: 4
Total Bilirubin: 0.5

## 2010-10-01 LAB — CBC
HCT: 41.9
MCV: 92.5
Platelets: 281
RBC: 4.53
WBC: 13.8 — ABNORMAL HIGH

## 2010-10-01 LAB — URINALYSIS, ROUTINE W REFLEX MICROSCOPIC
Bilirubin Urine: NEGATIVE
Hgb urine dipstick: NEGATIVE
Nitrite: NEGATIVE
Specific Gravity, Urine: 1.023
pH: 6.5

## 2010-10-01 LAB — PREGNANCY, URINE: Preg Test, Ur: NEGATIVE

## 2010-10-01 LAB — WET PREP, GENITAL: WBC, Wet Prep HPF POC: NONE SEEN

## 2010-10-01 LAB — LIPASE, BLOOD: Lipase: 25

## 2010-10-04 LAB — URINALYSIS, ROUTINE W REFLEX MICROSCOPIC
Glucose, UA: NEGATIVE
Specific Gravity, Urine: 1.02
pH: 5.5

## 2010-10-04 LAB — URINE MICROSCOPIC-ADD ON

## 2010-10-04 LAB — RAPID URINE DRUG SCREEN, HOSP PERFORMED
Amphetamines: NOT DETECTED
Barbiturates: NOT DETECTED

## 2010-10-19 LAB — URINALYSIS, ROUTINE W REFLEX MICROSCOPIC
Glucose, UA: NEGATIVE
Protein, ur: NEGATIVE
Specific Gravity, Urine: 1.026
pH: 6

## 2010-10-19 LAB — URINE MICROSCOPIC-ADD ON

## 2010-10-19 LAB — WET PREP, GENITAL

## 2010-11-02 ENCOUNTER — Emergency Department (HOSPITAL_COMMUNITY)
Admission: EM | Admit: 2010-11-02 | Discharge: 2010-11-03 | Disposition: A | Discharge status: Home / Self Care | Payer: BC Managed Care – PPO | Attending: Emergency Medicine | Admitting: Emergency Medicine

## 2010-11-02 DIAGNOSIS — S71009A Unspecified open wound, unspecified hip, initial encounter: Secondary | ICD-10-CM | POA: Insufficient documentation

## 2010-11-02 DIAGNOSIS — F329 Major depressive disorder, single episode, unspecified: Secondary | ICD-10-CM | POA: Insufficient documentation

## 2010-11-02 DIAGNOSIS — W1809XA Striking against other object with subsequent fall, initial encounter: Secondary | ICD-10-CM | POA: Insufficient documentation

## 2010-11-02 DIAGNOSIS — S71109A Unspecified open wound, unspecified thigh, initial encounter: Secondary | ICD-10-CM | POA: Insufficient documentation

## 2010-11-02 DIAGNOSIS — F3289 Other specified depressive episodes: Secondary | ICD-10-CM | POA: Insufficient documentation

## 2010-11-09 ENCOUNTER — Other Ambulatory Visit: Payer: Self-pay

## 2010-11-09 NOTE — Telephone Encounter (Signed)
Hydrocodone refill request denied, as I could not find on centricity or epic a chronic pain diagnosis, nor evidence I could tell of prior rx on the med histories

## 2010-11-09 NOTE — Telephone Encounter (Signed)
Pharmacy informed and patient as well.

## 2010-11-15 ENCOUNTER — Emergency Department (HOSPITAL_COMMUNITY)
Admission: EM | Admit: 2010-11-15 | Discharge: 2010-11-15 | Disposition: A | Discharge status: Home / Self Care | Payer: BC Managed Care – PPO | Attending: Emergency Medicine | Admitting: Emergency Medicine

## 2010-11-15 ENCOUNTER — Encounter (HOSPITAL_COMMUNITY): Payer: Self-pay | Admitting: Emergency Medicine

## 2010-11-15 DIAGNOSIS — Z4802 Encounter for removal of sutures: Secondary | ICD-10-CM | POA: Insufficient documentation

## 2010-11-15 NOTE — ED Notes (Signed)
Pt here to have sutures removed from left knee

## 2010-11-22 ENCOUNTER — Other Ambulatory Visit: Payer: Self-pay

## 2010-11-22 MED ORDER — ALPRAZOLAM 0.5 MG PO TABS: 0.5000 mg | ORAL_TABLET | Freq: Two times a day (BID) | ORAL | Status: DC | PRN

## 2010-11-22 NOTE — Telephone Encounter (Signed)
Done hardcopy to [facility]  

## 2010-11-22 NOTE — Telephone Encounter (Signed)
Faxed hardcopy to [facility] at [phone]

## 2011-01-10 ENCOUNTER — Encounter (HOSPITAL_COMMUNITY): Payer: Self-pay | Admitting: Emergency Medicine

## 2011-01-10 ENCOUNTER — Emergency Department (HOSPITAL_COMMUNITY): Payer: BC Managed Care – PPO

## 2011-01-10 ENCOUNTER — Other Ambulatory Visit: Payer: Self-pay

## 2011-01-10 ENCOUNTER — Emergency Department (HOSPITAL_COMMUNITY)
Admission: EM | Admit: 2011-01-10 | Discharge: 2011-01-10 | Disposition: A | Discharge status: Home / Self Care | Payer: BC Managed Care – PPO | Attending: Emergency Medicine | Admitting: Emergency Medicine

## 2011-01-10 DIAGNOSIS — R404 Transient alteration of awareness: Secondary | ICD-10-CM | POA: Insufficient documentation

## 2011-01-10 DIAGNOSIS — Z72820 Sleep deprivation: Secondary | ICD-10-CM | POA: Insufficient documentation

## 2011-01-10 DIAGNOSIS — F341 Dysthymic disorder: Secondary | ICD-10-CM | POA: Insufficient documentation

## 2011-01-10 DIAGNOSIS — M542 Cervicalgia: Secondary | ICD-10-CM | POA: Insufficient documentation

## 2011-01-10 DIAGNOSIS — F17200 Nicotine dependence, unspecified, uncomplicated: Secondary | ICD-10-CM | POA: Insufficient documentation

## 2011-01-10 DIAGNOSIS — R569 Unspecified convulsions: Secondary | ICD-10-CM

## 2011-01-10 DIAGNOSIS — F29 Unspecified psychosis not due to a substance or known physiological condition: Secondary | ICD-10-CM | POA: Insufficient documentation

## 2011-01-10 DIAGNOSIS — R51 Headache: Secondary | ICD-10-CM | POA: Insufficient documentation

## 2011-01-10 DIAGNOSIS — Z79899 Other long term (current) drug therapy: Secondary | ICD-10-CM | POA: Insufficient documentation

## 2011-01-10 HISTORY — DX: Unspecified convulsions: R56.9

## 2011-01-10 LAB — CBC
HCT: 37 % (ref 36.0–46.0)
Hemoglobin: 12.8 g/dL (ref 12.0–15.0)
MCHC: 34.6 g/dL (ref 30.0–36.0)

## 2011-01-10 LAB — BASIC METABOLIC PANEL
BUN: 8 mg/dL (ref 6–23)
Chloride: 99 mEq/L (ref 96–112)
GFR calc non Af Amer: >90 mL/min (ref >90)
Glucose, Bld: 103 mg/dL — ABNORMAL HIGH (ref 70–99)
Potassium: 4 mEq/L (ref 3.5–5.1)

## 2011-01-10 MED ORDER — OXYCODONE-ACETAMINOPHEN 5-325 MG PO TABS
1.0000 | ORAL_TABLET | Freq: Once | ORAL | Status: AC
  Administered 2011-01-10: 1 via ORAL
  Filled 2011-01-10: qty 1

## 2011-01-10 MED ORDER — MORPHINE SULFATE 2 MG/ML IJ SOLN
2.0000 mg | Freq: Once | INTRAMUSCULAR | Status: AC
  Administered 2011-01-10: 2 mg via INTRAVENOUS
  Filled 2011-01-10: qty 1

## 2011-01-10 MED ORDER — LORAZEPAM 2 MG/ML IJ SOLN
1.0000 mg | Freq: Once | INTRAMUSCULAR | Status: AC
  Administered 2011-01-10: 1 mg via INTRAVENOUS
  Filled 2011-01-10: qty 1

## 2011-01-10 NOTE — ED Notes (Signed)
Pt's co-worker stated pt was taking an order and started twitching and fell backwards and hit her head.  Per co-worker, pt was seizing for about seven minutes.  Pt does not remember anything prior to the event.  Pt is alert and oriented. Pupils equal and reactive to light. Pt complaining of a headache.

## 2011-01-10 NOTE — ED Notes (Signed)
C-Collar removed per MD.

## 2011-01-10 NOTE — ED Provider Notes (Signed)
History     CSN: [id number]  Arrival date & time 01/10/11  0050   First MD Initiated Contact with Patient 01/10/11 0107      Chief Complaint  Patient presents with  . Seizures     Patient is a 30 y.o. female presenting with seizures. The history is provided by the patient, the EMS personnel and a friend.  Seizures  This is a new problem. The current episode started less than 1 hour ago. The problem has been rapidly improving. There was 1 seizure. The most recent episode lasted more than 5 minutes. Associated symptoms include confusion and headaches. Characteristics include rhythmic jerking and loss of consciousness. The episode was witnessed. The seizure(s) had no focality. Possible causes include sleep deprivation. There has been no fever.  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 ANXIETY 05/20/2009  . DEPRESSION/ANXIETY 03/24/2008  . DEPRESSION 05/20/2009  . INSOMNIA-SLEEP DISORDER-UNSPEC 03/24/2008  . PSORIASIS 03/24/2008  . SKIN RASH 08/13/2009  . Seizure     Past Surgical History  Procedure Date  . S/p uterine polyp 2010    Family History  Problem Relation Age of Onset  . Diabetes Mother   . Anxiety disorder Mother   . Depression Father   . Diabetes Other     History  Substance Use Topics  . Smoking status: Current Everyday Smoker  . Smokeless tobacco: Not on file  . Alcohol Use: Yes     rare    OB History    Grav Para Term Preterm Abortions TAB SAB Ect Mult Living                  Review of Systems    Neurological: Positive for seizures, loss of consciousness and headaches.  Psychiatric/Behavioral: Positive for confusion.  All other systems reviewed and are negative.    Allergies  Review of patient's allergies indicates no known allergies.  Home Medications   Current Outpatient Rx  Name Route Sig Dispense Refill  . ADALIMUMAB 40 MG/0.8ML [place] KIT Subcutaneous Inject 0.8 mLs (40 mg total) into the skin every 14 (fourteen) days. 1 each 2  . ALPRAZOLAM 0.5 MG PO TABS Oral Take 1 tablet (0.5 mg total) by mouth 2 (two) times daily as needed. For anxiety, to fill Nov 23, 2010 60 tablet 2    BP 138/95  Pulse 105  Temp(Src) 98.6 F (37 C) (Oral)  Resp 20  SpO2 100%  Physical Exam  CONSTITUTIONAL: Well developed/well nourished HEAD AND FACE: Normocephalic/atraumatic EYES: EOMI/PERRL ENMT: Mucous membranes moist, no tongue lacerations noted NECK: supple no meningeal signs SPINE:cervical spine tender, TL spine nontender No bruising/crepitance/stepoffs noted to spine CV: S1/S2 noted, no murmurs/rubs/gallops noted LUNGS: Lungs are clear to auscultation bilaterally, no apparent distress ABDOMEN: soft, nontender, no rebound or guarding GU:no cva tenderness NEURO: Pt is awake/alert, moves all extremitiesx4 GCS 15 EXTREMITIES: pulses normal, full ROM, no tenderness noted SKIN: warm, color normal PSYCH: no abnormalities of mood noted   ED Course  Procedures  Labs Reviewed  POCT PREGNANCY, URINE  CBC  BASIC METABOLIC PANEL   1:19 AM Plan is to perform CT head for new onset seizure Also will image cspine after fall Pt stable at this time  3:19 AM Pt improved Ambulatory No new complaints Advised of results Doubt occult cspine injury Also - advised to avoid etoh, and to avoid driving/bathing alone Discussed need for followup  MDM  Nursing notes reviewed and considered in documentation All labs/vitals reviewed and considered xrays reviewed and  considered        Date: 01/10/2011  Rate: 102  Rhythm: sinus tachycardia  QRS Axis: normal  Intervals: normal  ST/T Wave abnormalities: nonspecific ST changes  Conduction Disutrbances:none  Narrative Interpretation:   Old EKG Reviewed: unchanged    Katelyn Gaskins, MD 01/10/11 [phone]

## 2011-01-10 NOTE — ED Notes (Signed)
Pt stated she does not take anything for her seizures.

## 2011-01-10 NOTE — ED Notes (Signed)
Pt had witnessed seizure in bar - was dx w/ seizure one month ago but has not followed up with neuro per EMS

## 2011-02-15 ENCOUNTER — Other Ambulatory Visit: Payer: Self-pay

## 2011-02-15 MED ORDER — ALPRAZOLAM 0.5 MG PO TABS: 0.5000 mg | ORAL_TABLET | Freq: Two times a day (BID) | ORAL | Status: DC | PRN

## 2011-02-15 NOTE — Telephone Encounter (Signed)
Done hardcopy to [facility]  

## 2011-02-15 NOTE — Telephone Encounter (Signed)
Faxed hardcopy to pharmacy. 

## 2011-03-02 ENCOUNTER — Emergency Department (HOSPITAL_COMMUNITY)
Admission: EM | Admit: 2011-03-02 | Discharge: 2011-03-02 | Disposition: A | Discharge status: Home / Self Care | Payer: BC Managed Care – PPO | Attending: Emergency Medicine | Admitting: Emergency Medicine

## 2011-03-02 ENCOUNTER — Encounter (HOSPITAL_COMMUNITY): Payer: Self-pay | Admitting: *Deleted

## 2011-03-02 ENCOUNTER — Emergency Department (HOSPITAL_COMMUNITY): Payer: BC Managed Care – PPO

## 2011-03-02 DIAGNOSIS — L408 Other psoriasis: Secondary | ICD-10-CM | POA: Insufficient documentation

## 2011-03-02 DIAGNOSIS — F341 Dysthymic disorder: Secondary | ICD-10-CM | POA: Insufficient documentation

## 2011-03-02 DIAGNOSIS — G4700 Insomnia, unspecified: Secondary | ICD-10-CM | POA: Insufficient documentation

## 2011-03-02 DIAGNOSIS — F17200 Nicotine dependence, unspecified, uncomplicated: Secondary | ICD-10-CM | POA: Insufficient documentation

## 2011-03-02 DIAGNOSIS — Z79899 Other long term (current) drug therapy: Secondary | ICD-10-CM | POA: Insufficient documentation

## 2011-03-02 DIAGNOSIS — R1031 Right lower quadrant pain: Secondary | ICD-10-CM | POA: Insufficient documentation

## 2011-03-02 LAB — BASIC METABOLIC PANEL WITH GFR
BUN: 7 mg/dL (ref 6–23)
CO2: 24 meq/L (ref 19–32)
Calcium: 9.4 mg/dL (ref 8.4–10.5)
Chloride: 98 meq/L (ref 96–112)
Creatinine, Ser: 0.54 mg/dL (ref 0.50–1.10)
GFR calc Af Amer: >90 mL/min
GFR calc non Af Amer: >90 mL/min
Glucose, Bld: 92 mg/dL (ref 70–99)
Potassium: 4 meq/L (ref 3.5–5.1)
Sodium: 134 meq/L — ABNORMAL LOW (ref 135–145)

## 2011-03-02 LAB — CBC
HCT: 43.2 % (ref 36.0–46.0)
MCV: 89.1 fL (ref 78.0–100.0)
RBC: 4.85 MIL/uL (ref 3.87–5.11)
WBC: 12.5 10*3/uL — ABNORMAL HIGH (ref 4.0–10.5)

## 2011-03-02 LAB — WET PREP, GENITAL: Clue Cells Wet Prep HPF POC: NONE SEEN

## 2011-03-02 LAB — URINALYSIS, ROUTINE W REFLEX MICROSCOPIC
Glucose, UA: NEGATIVE mg/dL
Hgb urine dipstick: NEGATIVE
Specific Gravity, Urine: <1.005 — ABNORMAL LOW (ref 1.005–1.030)

## 2011-03-02 LAB — GC/CHLAMYDIA PROBE AMP, GENITAL
Chlamydia, DNA Probe: NEGATIVE
GC Probe Amp, Genital: NEGATIVE

## 2011-03-02 MED ORDER — IOHEXOL 300 MG/ML  SOLN
20.0000 mL | INTRAMUSCULAR | Status: AC
  Administered 2011-03-02 (×2): 20 mL via ORAL

## 2011-03-02 MED ORDER — ONDANSETRON HCL 4 MG/2ML IJ SOLN
4.0000 mg | Freq: Once | INTRAMUSCULAR | Status: AC
  Administered 2011-03-02: 4 mg via INTRAVENOUS
  Filled 2011-03-02: qty 2

## 2011-03-02 MED ORDER — ONDANSETRON 4 MG PO TBDP: 4.0000 mg | ORAL_TABLET | Freq: Three times a day (TID) | ORAL | Status: AC | PRN

## 2011-03-02 MED ORDER — IOHEXOL 300 MG/ML  SOLN
100.0000 mL | Freq: Once | INTRAMUSCULAR | Status: AC | PRN
  Administered 2011-03-02: 100 mL via INTRAVENOUS

## 2011-03-02 MED ORDER — HYDROMORPHONE HCL PF 1 MG/ML IJ SOLN: 1.0000 mg | Freq: Once | INTRAMUSCULAR | Status: DC

## 2011-03-02 MED ORDER — HYDROMORPHONE HCL PF 1 MG/ML IJ SOLN
1.0000 mg | Freq: Once | INTRAMUSCULAR | Status: AC
  Administered 2011-03-02: 1 mg via INTRAVENOUS
  Filled 2011-03-02: qty 1

## 2011-03-02 MED ORDER — SODIUM CHLORIDE 0.9 % IV BOLUS (SEPSIS)
1000.0000 mL | Freq: Once | INTRAVENOUS | Status: AC
  Administered 2011-03-02: 1000 mL via INTRAVENOUS

## 2011-03-02 MED ORDER — HYDROCODONE-ACETAMINOPHEN 5-325 MG PO TABS: 1.0000 | ORAL_TABLET | Freq: Four times a day (QID) | ORAL | Status: AC | PRN

## 2011-03-02 MED ORDER — MORPHINE SULFATE 4 MG/ML IJ SOLN
4.0000 mg | Freq: Once | INTRAMUSCULAR | Status: AC
  Administered 2011-03-02: 4 mg via INTRAVENOUS
  Filled 2011-03-02: qty 1

## 2011-03-02 MED ORDER — HYDROCODONE-ACETAMINOPHEN 5-325 MG PO TABS
1.0000 | ORAL_TABLET | Freq: Once | ORAL | Status: AC
  Administered 2011-03-02: 1 via ORAL
  Filled 2011-03-02: qty 1

## 2011-03-02 NOTE — ED Notes (Addendum)
Ambulatory to bathroom without difficulty.   

## 2011-03-02 NOTE — ED Provider Notes (Signed)
History     CSN: [id number]  Arrival date & time 03/02/11  0309   First MD Initiated Contact with Patient 03/02/11 [phone]      Chief Complaint  Patient presents with  . Abdominal Pain     HPI  History provided by the patient. Patient is a 31 year old female with past history of anxiety and depression who presents with complaints of gradually increasing right lower quadrant abdominal pains for the past 3 days. Pain is constant and described as a soreness and ache feeling with occasional sharpness. Symptoms are moderate to severe at times. Patient denies any aggravating or alleviating factors. Patient reports having subjective fever and chills yesterday. She also has associated decreased appetite with slight nausea. Patient denies any episodes of vomiting, constipation or diarrhea. Patient denies any dysuria, hematuria, urinary frequency, flank pain, vaginal discharge or vaginal bleeding. She had normal menstrual cycle 5 days ago.   Past Medical History  Diagnosis Date  . ANXIETY 05/20/2009  . DEPRESSION/ANXIETY 03/24/2008  . DEPRESSION 05/20/2009  . INSOMNIA-SLEEP DISORDER-UNSPEC 03/24/2008  . PSORIASIS 03/24/2008  . SKIN RASH 08/13/2009  . Seizure     Past Surgical History  Procedure Date  . S/p uterine polyp 2010    Family History  Problem Relation Age of Onset  . Diabetes Mother   . Anxiety disorder Mother   . Depression Father   . Diabetes Other     History  Substance Use Topics  . Smoking status: Current Everyday Smoker  . Smokeless tobacco: Not on file  . Alcohol Use: Yes     rare    OB History    Grav Para Term Preterm Abortions TAB SAB Ect Mult Living                  Review of Systems  Constitutional: Positive for fever, chills and appetite change. Negative for fatigue.  Gastrointestinal: Positive for nausea and abdominal pain. Negative for vomiting, diarrhea and constipation.  Genitourinary: Negative for dysuria, frequency, hematuria, flank pain, vaginal  bleeding and vaginal discharge.  All other systems reviewed and are negative.    Allergies  Review of patient's allergies indicates no known allergies.  Home Medications   Current Outpatient Rx  Name Route Sig Dispense Refill  . ADALIMUMAB 40 MG/0.8ML [place] KIT Subcutaneous Inject 0.8 mLs (40 mg total) into the skin every 14 (fourteen) days. 1 each 2  . ALPRAZOLAM 0.5 MG PO TABS Oral Take 1 tablet (0.5 mg total) by mouth 2 (two) times daily as needed. For anxiety, to fill Feb 15, 2011 60 tablet 2    BP 147/94  Pulse 90  Temp(Src) 97.8 F (36.6 C) (Oral)  Resp 19  SpO2 96%  LMP 02/25/2011  Physical Exam  Nursing note and vitals reviewed. Constitutional: She is oriented to person, place, and time. She appears well-developed and well-nourished. No distress.  HENT:  Head: Normocephalic.  Cardiovascular: Normal rate and regular rhythm.   Pulmonary/Chest: Effort normal and breath sounds normal. No respiratory distress. She has no wheezes. She has no rales.  Abdominal: Soft. She exhibits no distension. There is tenderness in the right upper quadrant and right lower quadrant. There is rebound and tenderness at McBurney's point. There is no rigidity, no guarding and no CVA tenderness.  Genitourinary:       Chaperone was present  Neurological: She is alert and oriented to person, place, and time.  Skin: Skin is warm and dry. No rash noted.  Psychiatric: She has a normal  mood and affect. Her behavior is normal.    ED Course  Procedures    Results for orders placed during the hospital encounter of 03/02/11  URINALYSIS, ROUTINE W REFLEX MICROSCOPIC      Component Value Range   Color, Urine YELLOW  YELLOW    APPearance CLEAR  CLEAR    Specific Gravity, Urine <1.005 (*) 1.005 - 1.030    pH 5.5  5.0 - 8.0    Glucose, UA NEGATIVE  NEGATIVE (mg/dL)   Hgb urine dipstick NEGATIVE  NEGATIVE    Bilirubin Urine NEGATIVE  NEGATIVE    Ketones, ur NEGATIVE  NEGATIVE (mg/dL)   Protein, ur  NEGATIVE  NEGATIVE (mg/dL)   Urobilinogen, UA 0.2  0.0 - 1.0 (mg/dL)   Nitrite NEGATIVE  NEGATIVE    Leukocytes, UA NEGATIVE  NEGATIVE   CBC      Component Value Range   WBC 12.5 (*) 4.0 - 10.5 (K/uL)   RBC 4.85  3.87 - 5.11 (MIL/uL)   Hemoglobin 15.0  12.0 - 15.0 (g/dL)   HCT 16.1  09.6 - 04.5 (%)   MCV 89.1  78.0 - 100.0 (fL)   MCH 30.9  26.0 - 34.0 (pg)   MCHC 34.7  30.0 - 36.0 (g/dL)   RDW 40.9  81.1 - 91.4 (%)   Platelets 296  150 - 400 (K/uL)  BASIC METABOLIC PANEL      Component Value Range   Sodium 134 (*) 135 - 145 (mEq/L)   Potassium 4.0  3.5 - 5.1 (mEq/L)   Chloride 98  96 - 112 (mEq/L)   CO2 24  19 - 32 (mEq/L)   Glucose, Bld 92  70 - 99 (mg/dL)   BUN 7  6 - 23 (mg/dL)   Creatinine, Ser 7.82  0.50 - 1.10 (mg/dL)   Calcium 9.4  8.4 - 95.6 (mg/dL)   GFR calc non Af Amer >90  >90 (mL/min)   GFR calc Af Amer >90  >90 (mL/min)  POCT PREGNANCY, URINE      Component Value Range   Preg Test, Ur NEGATIVE  NEGATIVE   WET PREP, GENITAL      Component Value Range   Yeast Wet Prep HPF POC NONE SEEN  NONE SEEN    Trich, Wet Prep NONE SEEN  NONE SEEN    Clue Cells Wet Prep HPF POC NONE SEEN  NONE SEEN    WBC, Wet Prep HPF POC FEW (*) NONE SEEN   GC/CHLAMYDIA PROBE AMP, GENITAL      Component Value Range   GC Probe Amp, Genital NEGATIVE  NEGATIVE    Chlamydia, DNA Probe NEGATIVE  NEGATIVE      Ct Abdomen Pelvis W Contrast  03/02/2011  *RADIOLOGY REPORT*  Clinical Data: Pain, fever and chills.  Nausea.  CT ABDOMEN AND PELVIS WITH CONTRAST  Technique:  Multidetector CT imaging of the abdomen and pelvis was performed following the standard protocol during bolus administration of intravenous contrast.  Contrast: 1 OMNIPAQUE IOHEXOL 300 MG/ML IV SOLN, OMNIPAQUE IOHEXOL 300 MG/ML IV SOLN  Comparison: CT of the abdomen and pelvis 03/09/2007.  Findings:  Lung Bases: Unremarkable.  Abdomen/Pelvis:  The infused appearance of the liver, gallbladder, pancreas, spleen, bilateral  adrenal glands and bilateral kidneys is unremarkable.  Appendix is well visualized and is normal in appearance.  There is no ascites or pneumoperitoneum and no pathologic distension of bowel.  No definite pathologic adenopathy noted within the abdomen or pelvis.  Uterus, ovaries and urinary  bladder are unremarkable in appearance.  Musculoskeletal: There are no aggressive appearing lytic or blastic lesions noted in the visualized portions of the skeleton.  IMPRESSION: 1.  No acute findings in the abdomen or pelvis to account for the patient's symptoms.  Specifically, the appendix is well visualized and is normal in appearance.  Original Report Authenticated By: Florencia Reasons, M.D.     1. Abdominal pain       MDM  3:30 AM patient seen and evaluated. Patient no acute distress.   Patient seen and discussed with attending physician. He agrees with workup and treatment plan. CT scan still pending.   Patient discussed in sign out with Grant Fontana PA-C.  She will follow CT scan results. She has also performed a pelvic examination.      Angus Seller, [place] 03/04/11 2039

## 2011-03-02 NOTE — ED Provider Notes (Signed)
I assumed care of the patient from [place], PA-C. She had presented with 3 days of right lower quadrant abdominal pain which has been accompanied by fever and chills. Labs remarkable for a modest white count of 12.5. CT performed today was negative for acute appendicitis.   Pelvic exam was performed which was remarkable for slight midline suprapubic tenderness. There was no cervical motion tenderness. Wet prep was unremarkable.  Plan to tx symptomatically with analgesics. The patient's primary care doctor is Dr. Oliver Barre. Recommended that she make a followup appointment in the near future for a recheck. Return precautions discussed.  Filed Vitals:   03/02/11 0750  BP: 122/84  Pulse: 70  Temp: 98 F (36.7 C)  Resp: 16    Physical Exam  Genitourinary: Vagina normal, uterus normal, cervix normal, right adnexa normal and left adnexa normal. No vaginal discharge found.       No CMT Slight tenderness to palp to suprapubic area at midline   Results for orders placed during the hospital encounter of 03/02/11  URINALYSIS, ROUTINE W REFLEX MICROSCOPIC      Component Value Range   Color, Urine YELLOW  YELLOW    APPearance CLEAR  CLEAR    Specific Gravity, Urine <1.005 (*) 1.005 - 1.030    pH 5.5  5.0 - 8.0    Glucose, UA NEGATIVE  NEGATIVE (mg/dL)   Hgb urine dipstick NEGATIVE  NEGATIVE    Bilirubin Urine NEGATIVE  NEGATIVE    Ketones, ur NEGATIVE  NEGATIVE (mg/dL)   Protein, ur NEGATIVE  NEGATIVE (mg/dL)   Urobilinogen, UA 0.2  0.0 - 1.0 (mg/dL)   Nitrite NEGATIVE  NEGATIVE    Leukocytes, UA NEGATIVE  NEGATIVE   CBC      Component Value Range   WBC 12.5 (*) 4.0 - 10.5 (K/uL)   RBC 4.85  3.87 - 5.11 (MIL/uL)   Hemoglobin 15.0  12.0 - 15.0 (g/dL)   HCT 16.1  09.6 - 04.5 (%)   MCV 89.1  78.0 - 100.0 (fL)   MCH 30.9  26.0 - 34.0 (pg)   MCHC 34.7  30.0 - 36.0 (g/dL)   RDW 40.9  81.1 - 91.4 (%)   Platelets 296  150 - 400 (K/uL)  BASIC METABOLIC PANEL      Component Value Range   Sodium 134 (*) 135 - 145 (mEq/L)   Potassium 4.0  3.5 - 5.1 (mEq/L)   Chloride 98  96 - 112 (mEq/L)   CO2 24  19 - 32 (mEq/L)   Glucose, Bld 92  70 - 99 (mg/dL)   BUN 7  6 - 23 (mg/dL)   Creatinine, Ser 7.82  0.50 - 1.10 (mg/dL)   Calcium 9.4  8.4 - 95.6 (mg/dL)   GFR calc non Af Amer >90  >90 (mL/min)   GFR calc Af Amer >90  >90 (mL/min)  POCT PREGNANCY, URINE      Component Value Range   Preg Test, Ur NEGATIVE  NEGATIVE   WET PREP, GENITAL      Component Value Range   Yeast Wet Prep HPF POC NONE SEEN  NONE SEEN    Trich, Wet Prep NONE SEEN  NONE SEEN    Clue Cells Wet Prep HPF POC NONE SEEN  NONE SEEN    WBC, Wet Prep HPF POC FEW (*) NONE SEEN    Ct Abdomen Pelvis W Contrast  03/02/2011  *RADIOLOGY REPORT*  Clinical Data: Pain, fever and chills.  Nausea.  CT ABDOMEN AND PELVIS  WITH CONTRAST  Technique:  Multidetector CT imaging of the abdomen and pelvis was performed following the standard protocol during bolus administration of intravenous contrast.  Contrast: 1 OMNIPAQUE IOHEXOL 300 MG/ML IV SOLN, OMNIPAQUE IOHEXOL 300 MG/ML IV SOLN  Comparison: CT of the abdomen and pelvis 03/09/2007.  Findings:  Lung Bases: Unremarkable.  Abdomen/Pelvis:  The infused appearance of the liver, gallbladder, pancreas, spleen, bilateral adrenal glands and bilateral kidneys is unremarkable.  Appendix is well visualized and is normal in appearance.  There is no ascites or pneumoperitoneum and no pathologic distension of bowel.  No definite pathologic adenopathy noted within the abdomen or pelvis.  Uterus, ovaries and urinary bladder are unremarkable in appearance.  Musculoskeletal: There are no aggressive appearing lytic or blastic lesions noted in the visualized portions of the skeleton.  IMPRESSION: 1.  No acute findings in the abdomen or pelvis to account for the patient's symptoms.  Specifically, the appendix is well visualized and is normal in appearance.  Original Report Authenticated By: Florencia Reasons, M.D.    I personally reviewed the patient's CT.  [place], [place] 03/02/11 [phone]

## 2011-03-02 NOTE — Discharge Instructions (Signed)
Your labs and CT today did not show signs of acute appendicitis. Your vaginal swab was normal. We will treat your pain symptomatically. You've been given a prescription for analgesics and an antinausea medication. Please take these as needed. It is important to make a followup appointment with your primary care doctor for a recheck as soon as possible. If you're having high fever, worsening pain, intractable nausea and vomiting, or any other worrisome symptoms, please return to the ER.  Abdominal Pain (Nonspecific) Your exam might not show the exact reason you have abdominal pain. Since there are many different causes of abdominal pain, another checkup and more tests may be needed. It is very important to follow up for lasting (persistent) or worsening symptoms. A possible cause of abdominal pain in any person who still has his or her appendix is acute appendicitis. Appendicitis is often hard to diagnose. Normal blood tests, urine tests, ultrasound, and CT scans do not completely rule out early appendicitis or other causes of abdominal pain. Sometimes, only the changes that happen over time will allow appendicitis and other causes of abdominal pain to be determined. Other potential problems that may require surgery may also take time to become more apparent. Because of this, it is important that you follow all of the instructions below. HOME CARE INSTRUCTIONS   Rest as much as possible.   Do not eat solid food until your pain is gone.   While adults or children have pain: A diet of water, weak decaffeinated tea, broth or bouillon, gelatin, oral rehydration solutions (ORS), frozen ice pops, or ice chips may be helpful.   When pain is gone in adults or children: Start a light diet (dry toast, crackers, applesauce, or white rice). Increase the diet slowly as long as it does not bother you. Eat no dairy products (including cheese and eggs) and no spicy, fatty, fried, or high-fiber foods.   Use no alcohol,  caffeine, or cigarettes.   Take your regular medicines unless your caregiver told you not to.   Take any prescribed medicine as directed.   Only take over-the-counter or prescription medicines for pain, discomfort, or fever as directed by your caregiver. Do not give aspirin to children.  If your caregiver has given you a follow-up appointment, it is very important to keep that appointment. Not keeping the appointment could result in a permanent injury and/or lasting (chronic) pain and/or disability. If there is any problem keeping the appointment, you must call to reschedule.  SEEK IMMEDIATE MEDICAL CARE IF:   Your pain is not gone in 24 hours.   Your pain becomes worse, changes location, or feels different.   You or your child has an oral temperature above 102 F (38.9 C), not controlled by medicine.   Your baby is older than 3 months with a rectal temperature of 102 F (38.9 C) or higher.   Your baby is 41 months old or younger with a rectal temperature of 100.4 F (38 C) or higher.   You have shaking chills.   You keep throwing up (vomiting) or cannot drink liquids.   There is blood in your vomit or you see blood in your bowel movements.   Your bowel movements become dark or black.   You have frequent bowel movements.   Your bowel movements stop (become blocked) or you cannot pass gas.   You have bloody, frequent, or painful urination.   You have yellow discoloration in the skin or whites of the eyes.  Your stomach becomes bloated or bigger.   You have dizziness or fainting.   You have chest or back pain.  MAKE SURE YOU:   Understand these instructions.   Will watch your condition.   Will get help right away if you are not doing well or get worse.  Document Released: 12/27/2004 Document Revised: 09/08/2010 Document Reviewed: 11/24/2008 [facility] Patient Information 2012 [place], [place].

## 2011-03-02 NOTE — ED Notes (Signed)
Pt is here for 3 days of RLQ abdominal pain.  Pt has had fever and chills with this.  Pt has pain with palpation in this area.  Pt denies any vomiting but has had some nausea.

## 2011-03-02 NOTE — ED Provider Notes (Signed)
31 year old female comes in with a three-day history of right lower quadrant pain which has gotten worse over the last 24 hours. There has not been nausea or vomiting, but she does have anorexia. She does relate pain got worse if the car went over a bump. Exam shows fairly well localized tenderness in McBurney's area with percussion tenderness. There is no guarding and no definite rebound tenderness. Peristalsis is present but diminished. Overall picture is suspicious for appendicitis and CT scan has been ordered.  Dione Booze, MD 03/02/11 [phone]

## 2011-03-02 NOTE — ED Notes (Signed)
Pt is here with right sided abd pain that increases with movement for last couple of days.  Bowel sounds present.  Pt has finished 2 cups of po contrast.  Pt medicated for pain

## 2011-03-02 NOTE — ED Notes (Signed)
PT is back from CT

## 2011-03-03 NOTE — ED Provider Notes (Signed)
Medical screening examination/treatment/procedure(s) were conducted as a shared visit with non-physician practitioner(s) and myself.  I personally evaluated the patient during the encounter   Dione Booze, MD 03/03/11 [phone]

## 2011-03-04 ENCOUNTER — Emergency Department (HOSPITAL_COMMUNITY)
Admission: EM | Admit: 2011-03-04 | Discharge: 2011-03-04 | Disposition: A | Discharge status: Home Health | Payer: BC Managed Care – PPO | Attending: Emergency Medicine | Admitting: Emergency Medicine

## 2011-03-04 ENCOUNTER — Encounter (HOSPITAL_COMMUNITY): Payer: Self-pay | Admitting: *Deleted

## 2011-03-04 ENCOUNTER — Emergency Department (HOSPITAL_COMMUNITY): Payer: BC Managed Care – PPO

## 2011-03-04 DIAGNOSIS — F17200 Nicotine dependence, unspecified, uncomplicated: Secondary | ICD-10-CM | POA: Insufficient documentation

## 2011-03-04 DIAGNOSIS — R109 Unspecified abdominal pain: Secondary | ICD-10-CM | POA: Insufficient documentation

## 2011-03-04 DIAGNOSIS — G4700 Insomnia, unspecified: Secondary | ICD-10-CM | POA: Insufficient documentation

## 2011-03-04 DIAGNOSIS — F329 Major depressive disorder, single episode, unspecified: Secondary | ICD-10-CM | POA: Insufficient documentation

## 2011-03-04 DIAGNOSIS — F411 Generalized anxiety disorder: Secondary | ICD-10-CM | POA: Insufficient documentation

## 2011-03-04 DIAGNOSIS — N390 Urinary tract infection, site not specified: Secondary | ICD-10-CM | POA: Insufficient documentation

## 2011-03-04 DIAGNOSIS — F3289 Other specified depressive episodes: Secondary | ICD-10-CM | POA: Insufficient documentation

## 2011-03-04 DIAGNOSIS — R000 Tachycardia, unspecified: Secondary | ICD-10-CM | POA: Insufficient documentation

## 2011-03-04 DIAGNOSIS — R1031 Right lower quadrant pain: Secondary | ICD-10-CM | POA: Insufficient documentation

## 2011-03-04 LAB — BASIC METABOLIC PANEL
Calcium: 9.4 mg/dL (ref 8.4–10.5)
GFR calc Af Amer: >90 mL/min (ref >90)
GFR calc non Af Amer: >90 mL/min (ref >90)
Glucose, Bld: 86 mg/dL (ref 70–99)
Potassium: 3.7 mEq/L (ref 3.5–5.1)
Sodium: 137 mEq/L (ref 135–145)

## 2011-03-04 LAB — DIFFERENTIAL
Basophils Absolute: 0 10*3/uL (ref 0.0–0.1)
Basophils Relative: 0 % (ref 0–1)
Eosinophils Relative: 0 % (ref 0–5)
Monocytes Absolute: 1.2 10*3/uL — ABNORMAL HIGH (ref 0.1–1.0)

## 2011-03-04 LAB — CBC
HCT: 43.4 % (ref 36.0–46.0)
MCHC: 33.6 g/dL (ref 30.0–36.0)
MCV: 90.4 fL (ref 78.0–100.0)
Platelets: 267 10*3/uL (ref 150–400)
RDW: 13.6 % (ref 11.5–15.5)
WBC: 8.5 10*3/uL (ref 4.0–10.5)

## 2011-03-04 LAB — URINE MICROSCOPIC-ADD ON

## 2011-03-04 LAB — URINALYSIS, ROUTINE W REFLEX MICROSCOPIC
Glucose, UA: NEGATIVE mg/dL
Nitrite: NEGATIVE
Urobilinogen, UA: 1 mg/dL (ref 0.0–1.0)

## 2011-03-04 LAB — POCT PREGNANCY, URINE: Preg Test, Ur: NEGATIVE

## 2011-03-04 MED ORDER — SODIUM CHLORIDE 0.9 % IV BOLUS (SEPSIS)
1000.0000 mL | Freq: Once | INTRAVENOUS | Status: AC
  Administered 2011-03-04: 1000 mL via INTRAVENOUS

## 2011-03-04 MED ORDER — HYDROMORPHONE HCL PF 1 MG/ML IJ SOLN
1.0000 mg | Freq: Once | INTRAMUSCULAR | Status: AC
  Administered 2011-03-04: 1 mg via INTRAVENOUS
  Filled 2011-03-04: qty 1

## 2011-03-04 MED ORDER — HYDROMORPHONE HCL PF 2 MG/ML IJ SOLN
2.0000 mg | Freq: Once | INTRAMUSCULAR | Status: AC
  Administered 2011-03-04: 2 mg via INTRAVENOUS
  Filled 2011-03-04: qty 1

## 2011-03-04 MED ORDER — ONDANSETRON HCL 4 MG/2ML IJ SOLN
4.0000 mg | Freq: Once | INTRAMUSCULAR | Status: AC
  Administered 2011-03-04: 4 mg via INTRAVENOUS
  Filled 2011-03-04: qty 2

## 2011-03-04 MED ORDER — OXYCODONE-ACETAMINOPHEN 5-325 MG PO TABS: 2.0000 | ORAL_TABLET | ORAL | Status: AC | PRN

## 2011-03-04 MED ORDER — NITROFURANTOIN MONOHYD MACRO 100 MG PO CAPS: 100.0000 mg | ORAL_CAPSULE | Freq: Two times a day (BID) | ORAL | Status: AC

## 2011-03-04 MED ORDER — KETOROLAC TROMETHAMINE 30 MG/ML IJ SOLN
30.0000 mg | Freq: Once | INTRAMUSCULAR | Status: AC
  Administered 2011-03-04: 30 mg via INTRAVENOUS
  Filled 2011-03-04: qty 1

## 2011-03-04 MED ORDER — CEFTRIAXONE SODIUM 1 G IJ SOLR
1.0000 g | Freq: Once | INTRAMUSCULAR | Status: AC
  Administered 2011-03-04: 1 g via INTRAVENOUS
  Filled 2011-03-04: qty 10

## 2011-03-04 MED ORDER — HYDROMORPHONE HCL PF 2 MG/ML IJ SOLN: 2.0000 mg | Freq: Once | INTRAMUSCULAR | Status: DC

## 2011-03-04 NOTE — ED Notes (Signed)
Pt in c/o abd pain since 2/13, seen at [facility] and told to follow up, today pain is increased, states CT scan was negative, unable to follow up til Monday, denies n/v

## 2011-03-04 NOTE — ED Provider Notes (Signed)
History     CSN: [id number]  Arrival date & time 03/04/11  1631   First MD Initiated Contact with Patient 03/04/11 1738      Chief Complaint  Patient presents with  . Abdominal Pain    (Consider location/radiation/quality/duration/timing/severity/associated sxs/prior treatment) HPI History provided by pt.  Pt has had constant, non-radiating, gradually worsening, RLQ pain since 02/23/11.  Has intermittent, brief sharp pains, particularly when she urinates.  Associated w/ anorexia.  Denies fever, N/V/D, hematochezia, GU sx.  Per prior chart, pt seen for same in ED on 03/02/11.  Had a CT abd/pelvis at that time that was negative for acute pathology.  D/c'd home w/ vicodin and zofran and instructed to return if symptoms worsen.  Has not had relief w/ prescribed medications.   Past Medical History  Diagnosis Date  . ANXIETY 05/20/2009  . DEPRESSION/ANXIETY 03/24/2008  . DEPRESSION 05/20/2009  . INSOMNIA-SLEEP DISORDER-UNSPEC 03/24/2008  . PSORIASIS 03/24/2008  . SKIN RASH 08/13/2009  . Seizure     Past Surgical History  Procedure Date  . S/p uterine polyp 2010    Family History  Problem Relation Age of Onset  . Diabetes Mother   . Anxiety disorder Mother   . Depression Father   . Diabetes Other     History  Substance Use Topics  . Smoking status: Current Everyday Smoker  . Smokeless tobacco: Not on file  . Alcohol Use: Yes     rare    OB History    Grav Para Term Preterm Abortions TAB SAB Ect Mult Living                  Review of Systems  All other systems reviewed and are negative.    Allergies  Review of patient's allergies indicates no known allergies.  Home Medications   Current Outpatient Rx  Name Route Sig Dispense Refill  . ADALIMUMAB 40 MG/0.8ML [place] KIT Subcutaneous Inject 0.8 mLs (40 mg total) into the skin every 14 (fourteen) days. 1 each 2  . ALPRAZOLAM 0.5 MG PO TABS Oral Take 1 tablet (0.5 mg total) by mouth 2 (two) times daily as needed. For  anxiety, to fill Feb 15, 2011 60 tablet 2  . HYDROCODONE-ACETAMINOPHEN 5-325 MG PO TABS Oral Take 1 tablet by mouth every 6 (six) hours as needed for pain. 15 tablet 0  . IMMUNE SYSTEM BOOSTER PO Oral Take 1 packet by mouth once. Dissolve 1 packet in water as needed for immune support.    Marland Kitchen ONDANSETRON 4 MG PO TBDP Oral Take 1 tablet (4 mg total) by mouth every 8 (eight) hours as needed for nausea. 15 tablet 0  . TETRAHYDROZOLINE HCL 0.05 % OP SOLN Both Eyes Place 1 drop into both eyes daily. As needed for pain/dryness.      BP 150/84  Pulse 112  Temp(Src) 98.6 F (37 C) (Oral)  Resp 20  SpO2 100%  LMP 02/25/2011  Physical Exam  Nursing note and vitals reviewed. Constitutional: She is oriented to person, place, and time. She appears well-developed and well-nourished. No distress.  HENT:  Head: Normocephalic and atraumatic.  Eyes:       Normal appearance  Neck: Normal range of motion.  Cardiovascular: Regular rhythm.        Mild tachycardia  Pulmonary/Chest: Effort normal and breath sounds normal.  Abdominal: Soft. Bowel sounds are normal. She exhibits no distension and no mass. There is no rebound and no guarding.       Diffuse,  mild ttp of abdomen and pelvis, reported to be worst in RLQ.  Mild CVA tenderness bilaterally.   Neurological: She is alert and oriented to person, place, and time.  Skin: Skin is warm and dry. No rash noted.  Psychiatric: She has a normal mood and affect. Her behavior is normal.    ED Course  Procedures (including critical care time)   Labs Reviewed  POCT PREGNANCY, URINE  URINALYSIS, ROUTINE W REFLEX MICROSCOPIC   No results found.   No diagnosis found.    MDM  Healthy 31yo F presents for the second time w/ RLQ pain.  Neg CT abd/pelvis 2 days ago but pain has worsened.  On exam, pt in NAD, diffuse, mild abd ttp (reported to be worst in RLQ).  Did not perform pelvic exam because had one 2 days ago.  Pregnancy test negative.  U/A pending.  Transvaginal US ordered to r/o pathology that might have been missed on CT.  Pt has received IV dilaudid for pain.  [place], PA-C to dispo.         Arie Sabina [place], [place] 03/04/11 [phone]

## 2011-03-04 NOTE — Discharge Instructions (Signed)
Take percocet as needed for severe pain (this medication is a little bit stronger than the vicodin).  Do not drive within four hours of taking this medication (may cause drowsiness or confusion).  Take macrobid as prescribed for UTI.  Follow up with your gynecologist as well as the gastroenterologist as soon as possible.   You may return to the ER if symptoms worsen or you have any other concerns. Please followup with the urology specialist tomorrow.   Abdominal Pain Abdominal pain can be caused by many things. Your caregiver decides the seriousness of your pain by an examination and possibly blood tests and X-rays. Many cases can be observed and treated at home. Most abdominal pain is not caused by a disease and will probably improve without treatment. However, in many cases, more time must pass before a clear cause of the pain can be found. Before that point, it may not be known if you need more testing, or if hospitalization or surgery is needed. HOME CARE INSTRUCTIONS   Do not take laxatives unless directed by your caregiver.   Take pain medicine only as directed by your caregiver.   Only take over-the-counter or prescription medicines for pain, discomfort, or fever as directed by your caregiver.   Try a clear liquid diet (broth, tea, or water) for as long as directed by your caregiver. Slowly move to a bland diet as tolerated.  SEEK IMMEDIATE MEDICAL CARE IF:   The pain does not go away.   You have a fever.   You keep throwing up (vomiting).   The pain is felt only in portions of the abdomen. Pain in the right side could possibly be appendicitis. In an adult, pain in the left lower portion of the abdomen could be colitis or diverticulitis.   You pass bloody or black tarry stools.  MAKE SURE YOU:   Understand these instructions.   Will watch your condition.   Will get help right away if you are not doing well or get worse.  Document Released: 10/06/2004 Document Revised: 09/08/2010  Document Reviewed: 08/15/2007 [facility] Patient Information 2012 [place], [place].   Urinary Tract Infection Infections of the urinary tract can start in several places. A bladder infection (cystitis), a kidney infection (pyelonephritis), and a prostate infection (prostatitis) are different types of urinary tract infections (UTIs). They usually get better if treated with medicines (antibiotics) that kill germs. Take all the medicine until it is gone. You or your child may feel better in a few days, but TAKE ALL MEDICINE or the infection may not respond and may become more difficult to treat. HOME CARE INSTRUCTIONS   Drink enough water and fluids to keep the urine clear or pale yellow. Cranberry juice is especially recommended, in addition to large amounts of water.   Avoid caffeine, tea, and carbonated beverages. They tend to irritate the bladder.   Alcohol may irritate the prostate.   Only take over-the-counter or prescription medicines for pain, discomfort, or fever as directed by your caregiver.  To prevent further infections:  Empty the bladder often. Avoid holding urine for long periods of time.   After a bowel movement, women should cleanse from front to back. Use each tissue only once.   Empty the bladder before and after sexual intercourse.  FINDING OUT THE RESULTS OF YOUR TEST Not all test results are available during your visit. If your or your child's test results are not back during the visit, make an appointment with your caregiver to find out  the results. Do not assume everything is normal if you have not heard from your caregiver or the medical facility. It is important for you to follow up on all test results. SEEK MEDICAL CARE IF:   There is back pain.   Your baby is older than 3 months with a rectal temperature of 100.5 F (38.1 C) or higher for more than 1 day.   Your or your child's problems (symptoms) are no better in 3 days. Return sooner if you or your child is  getting worse.  SEEK IMMEDIATE MEDICAL CARE IF:   There is severe back pain or lower abdominal pain.   You or your child develops chills.   You have a fever.   Your baby is older than 3 months with a rectal temperature of 102 F (38.9 C) or higher.   Your baby is 59 months old or younger with a rectal temperature of 100.4 F (38 C) or higher.   There is nausea or vomiting.   There is continued burning or discomfort with urination.  MAKE SURE YOU:   Understand these instructions.   Will watch your condition.   Will get help right away if you are not doing well or get worse.  Document Released: 10/06/2004 Document Revised: 09/08/2010 Document Reviewed: 05/11/2006 [facility] Patient Information 2012 [place], [place].

## 2011-03-04 NOTE — ED Provider Notes (Signed)
Katelyn Bridges S 8:00PM patient discussed in sign out with Ruby Cola PA-C. Patient returns to emergency room with continued right abdominal pains. Patient awaiting ultrasound of pelvis to rule out gynecological etiology. UA does show signs for UTI. Plan to continue evaluation and symptom management.  Patient continues to have significant pains. Ultrasounds did not demonstrate any gynecological source her symptoms. No ovarian cyst, no ovarian torsion. Will repeat lab tests look for change in WBC. UA does show signs for calcium oxalate crystals we'll also obtain KUB to look for kidney stone. No kidney stone demonstrated on recent CT scan.    Results for orders placed during the hospital encounter of 03/04/11  URINALYSIS, ROUTINE W REFLEX MICROSCOPIC      Component Value Range   Color, Urine YELLOW  YELLOW    APPearance CLOUDY (*) CLEAR    Specific Gravity, Urine 1.029  1.005 - 1.030    pH 6.5  5.0 - 8.0    Glucose, UA NEGATIVE  NEGATIVE (mg/dL)   Hgb urine dipstick TRACE (*) NEGATIVE    Bilirubin Urine NEGATIVE  NEGATIVE    Ketones, ur TRACE (*) NEGATIVE (mg/dL)   Protein, ur 454 (*) NEGATIVE (mg/dL)   Urobilinogen, UA 1.0  0.0 - 1.0 (mg/dL)   Nitrite NEGATIVE  NEGATIVE    Leukocytes, UA SMALL (*) NEGATIVE   POCT PREGNANCY, URINE      Component Value Range   Preg Test, Ur NEGATIVE  NEGATIVE   URINE MICROSCOPIC-ADD ON      Component Value Range   WBC, UA 11-20  <3 (WBC/hpf)   RBC / HPF 3-6  <3 (RBC/hpf)   Bacteria, UA FEW (*) RARE    Crystals CA OXALATE CRYSTALS (*) NEGATIVE   CBC      Component Value Range   WBC 8.5  4.0 - 10.5 (K/uL)   RBC 4.80  3.87 - 5.11 (MIL/uL)   Hemoglobin 14.6  12.0 - 15.0 (g/dL)   HCT 09.8  11.9 - 14.7 (%)   MCV 90.4  78.0 - 100.0 (fL)   MCH 30.4  26.0 - 34.0 (pg)   MCHC 33.6  30.0 - 36.0 (g/dL)   RDW 82.9  56.2 - 13.0 (%)   Platelets 267  150 - 400 (K/uL)  DIFFERENTIAL      Component Value Range   Neutrophils Relative 58  43 - 77 (%)   Neutro Abs 4.9  1.7 - 7.7 (K/uL)   Lymphocytes Relative 28  12 - 46 (%)   Lymphs Abs 2.4  0.7 - 4.0 (K/uL)   Monocytes Relative 14 (*) 3 - 12 (%)   Monocytes Absolute 1.2 (*) 0.1 - 1.0 (K/uL)   Eosinophils Relative 0  0 - 5 (%)   Eosinophils Absolute 0.0  0.0 - 0.7 (K/uL)   Basophils Relative 0  0 - 1 (%)   Basophils Absolute 0.0  0.0 - 0.1 (K/uL)  BASIC METABOLIC PANEL      Component Value Range   Sodium 137  135 - 145 (mEq/L)   Potassium 3.7  3.5 - 5.1 (mEq/L)   Chloride 98  96 - 112 (mEq/L)   CO2 28  19 - 32 (mEq/L)   Glucose, Bld 86  70 - 99 (mg/dL)   BUN 6  6 - 23 (mg/dL)   Creatinine, Ser 8.65  0.50 - 1.10 (mg/dL)   Calcium 9.4  8.4 - 78.4 (mg/dL)   GFR calc non Af Amer >90  >90 (mL/min)   GFR calc Af Amer >  90  >90 (mL/min)   Labs unremarkable except for signs for possible UTI on UA. There is improvement of her WBC from 2 days ago. KUB does not demonstrate any signs for kidney stone. Plan to provide urology followup.    Angus Seller, [place] 03/04/11 2214

## 2011-03-04 NOTE — ED Notes (Signed)
Patient transported to Ultrasound on stretcher.  Family at b/s.

## 2011-03-04 NOTE — ED Notes (Signed)
Pt. Is going to ultrasound need a full bladder,NurseLeia Alf) never told her to void

## 2011-03-05 NOTE — ED Provider Notes (Signed)
Medical screening examination/treatment/procedure(s) were performed by non-physician practitioner and as supervising physician I was immediately available for consultation/collaboration.  Flint Melter, MD 03/05/11 [phone]

## 2011-03-07 NOTE — ED Provider Notes (Signed)
Medical screening examination/treatment/procedure(s) were conducted as a shared visit with non-physician practitioner(s) and myself.  I personally evaluated the patient during the encounter   Dione Booze, MD 03/07/11 [phone]

## 2011-04-05 ENCOUNTER — Emergency Department (HOSPITAL_COMMUNITY)
Admission: EM | Admit: 2011-04-05 | Discharge: 2011-04-05 | Disposition: A | Discharge status: Home / Self Care | Payer: BC Managed Care – PPO | Attending: Emergency Medicine | Admitting: Emergency Medicine

## 2011-04-05 ENCOUNTER — Emergency Department (HOSPITAL_COMMUNITY): Payer: BC Managed Care – PPO

## 2011-04-05 DIAGNOSIS — R110 Nausea: Secondary | ICD-10-CM | POA: Insufficient documentation

## 2011-04-05 DIAGNOSIS — K5900 Constipation, unspecified: Secondary | ICD-10-CM | POA: Insufficient documentation

## 2011-04-05 DIAGNOSIS — R10814 Left lower quadrant abdominal tenderness: Secondary | ICD-10-CM | POA: Insufficient documentation

## 2011-04-05 DIAGNOSIS — L408 Other psoriasis: Secondary | ICD-10-CM | POA: Insufficient documentation

## 2011-04-05 DIAGNOSIS — R109 Unspecified abdominal pain: Secondary | ICD-10-CM | POA: Insufficient documentation

## 2011-04-05 DIAGNOSIS — R300 Dysuria: Secondary | ICD-10-CM | POA: Insufficient documentation

## 2011-04-05 DIAGNOSIS — F329 Major depressive disorder, single episode, unspecified: Secondary | ICD-10-CM | POA: Insufficient documentation

## 2011-04-05 DIAGNOSIS — F411 Generalized anxiety disorder: Secondary | ICD-10-CM | POA: Insufficient documentation

## 2011-04-05 DIAGNOSIS — R10819 Abdominal tenderness, unspecified site: Secondary | ICD-10-CM | POA: Insufficient documentation

## 2011-04-05 DIAGNOSIS — F17200 Nicotine dependence, unspecified, uncomplicated: Secondary | ICD-10-CM | POA: Insufficient documentation

## 2011-04-05 DIAGNOSIS — K648 Other hemorrhoids: Secondary | ICD-10-CM | POA: Insufficient documentation

## 2011-04-05 DIAGNOSIS — F3289 Other specified depressive episodes: Secondary | ICD-10-CM | POA: Insufficient documentation

## 2011-04-05 DIAGNOSIS — K921 Melena: Secondary | ICD-10-CM | POA: Insufficient documentation

## 2011-04-05 LAB — COMPREHENSIVE METABOLIC PANEL
ALT: 27 U/L (ref 0–35)
Albumin: 4.2 g/dL (ref 3.5–5.2)
Alkaline Phosphatase: 70 U/L (ref 39–117)
Chloride: 104 mEq/L (ref 96–112)
GFR calc Af Amer: >90 mL/min (ref >90)
Glucose, Bld: 87 mg/dL (ref 70–99)
Potassium: 3.9 mEq/L (ref 3.5–5.1)
Sodium: 141 mEq/L (ref 135–145)
Total Bilirubin: 0.2 mg/dL — ABNORMAL LOW (ref 0.3–1.2)
Total Protein: 7.8 g/dL (ref 6.0–8.3)

## 2011-04-05 LAB — DIFFERENTIAL
Eosinophils Absolute: 0.2 10*3/uL (ref 0.0–0.7)
Lymphs Abs: 4.5 10*3/uL — ABNORMAL HIGH (ref 0.7–4.0)
Neutro Abs: 4.7 10*3/uL (ref 1.7–7.7)
Neutrophils Relative %: 46 % (ref 43–77)

## 2011-04-05 LAB — OCCULT BLOOD, POC DEVICE: Fecal Occult Bld: NEGATIVE

## 2011-04-05 LAB — CBC
Hemoglobin: 15.1 g/dL — ABNORMAL HIGH (ref 12.0–15.0)
MCH: 30.8 pg (ref 26.0–34.0)
Platelets: 304 10*3/uL (ref 150–400)
RBC: 4.9 MIL/uL (ref 3.87–5.11)
WBC: 10.1 10*3/uL (ref 4.0–10.5)

## 2011-04-05 LAB — URINALYSIS, ROUTINE W REFLEX MICROSCOPIC
Bilirubin Urine: NEGATIVE
Hgb urine dipstick: NEGATIVE
Specific Gravity, Urine: 1.014 (ref 1.005–1.030)
pH: 5 (ref 5.0–8.0)

## 2011-04-05 MED ORDER — IOHEXOL 300 MG/ML  SOLN
100.0000 mL | Freq: Once | INTRAMUSCULAR | Status: AC | PRN
  Administered 2011-04-05: 100 mL via INTRAVENOUS

## 2011-04-05 MED ORDER — HYDROMORPHONE HCL PF 1 MG/ML IJ SOLN
INTRAMUSCULAR | Status: AC
  Filled 2011-04-05: qty 1

## 2011-04-05 MED ORDER — HYDROMORPHONE HCL PF 1 MG/ML IJ SOLN
1.0000 mg | Freq: Once | INTRAMUSCULAR | Status: AC
  Administered 2011-04-05: 1 mg via INTRAVENOUS
  Filled 2011-04-05: qty 1

## 2011-04-05 MED ORDER — HYDROMORPHONE HCL PF 1 MG/ML IJ SOLN
1.0000 mg | Freq: Once | INTRAMUSCULAR | Status: AC
  Administered 2011-04-05: 1 mg via INTRAVENOUS

## 2011-04-05 MED ORDER — ONDANSETRON HCL 4 MG/2ML IJ SOLN
4.0000 mg | Freq: Once | INTRAMUSCULAR | Status: AC
  Administered 2011-04-05: 4 mg via INTRAVENOUS
  Filled 2011-04-05: qty 2

## 2011-04-05 MED ORDER — ONDANSETRON HCL 4 MG PO TABS: 4.0000 mg | ORAL_TABLET | Freq: Three times a day (TID) | ORAL | Status: AC | PRN

## 2011-04-05 MED ORDER — HYDROCORTISONE ACETATE 25 MG RE SUPP: 25.0000 mg | Freq: Two times a day (BID) | RECTAL | Status: AC

## 2011-04-05 MED ORDER — SODIUM CHLORIDE 0.9 % IV SOLN
1000.0000 mL | Freq: Once | INTRAVENOUS | Status: AC
  Administered 2011-04-05: 1000 mL via INTRAVENOUS

## 2011-04-05 MED ORDER — SODIUM CHLORIDE 0.9 % IV SOLN
1000.0000 mL | INTRAVENOUS | Status: DC
  Administered 2011-04-05: 1000 mL via INTRAVENOUS

## 2011-04-05 MED ORDER — PERCOCET 5-325 MG PO TABS: 1.0000 | ORAL_TABLET | ORAL | Status: AC | PRN

## 2011-04-05 NOTE — ED Notes (Signed)
Pt reports intermittent abdominal pain x2 months.  Pt reports some nausea, no vomiting.  Pt reports seeing blood with BM yesterday.

## 2011-04-05 NOTE — ED Provider Notes (Signed)
History     CSN: [id number]  Arrival date & time 04/05/11  4098   First MD Initiated Contact with Patient 04/05/11 [phone]      Chief Complaint  Patient presents with  . Abdominal Pain    (Consider location/radiation/quality/duration/timing/severity/associated sxs/prior treatment) HPI Comments: Patient reports diffuse abdominal pain for over a month.  States that the pain is constant and crampy with occasional sharp stabbing pain.  Pain is exacerbated by movement, position, and palpation.  Has been taking ibuprofen without improvement. Pt has associated anorexia, hematochezia, nausea, and dysuria.  The dysuria is described as extreme pain with urination.  Hematochezia has been going on for 4 days, is in both the toilet water and on the toilet paper.  Denies melena.  Denies fever, vomiting, abnormal vaginal discharge or bleeding.  LMP March 17 was one week late.    Patient is a 31 y.o. female presenting with abdominal pain. The history is provided by the patient.  Abdominal Pain The primary symptoms of the illness include abdominal pain, nausea and dysuria. The primary symptoms of the illness do not include fever, vomiting, diarrhea, vaginal discharge or vaginal bleeding.  The dysuria is not associated with frequency or urgency.   Additional symptoms associated with the illness include constipation. Symptoms associated with the illness do not include urgency or frequency.    Past Medical History  Diagnosis Date  . ANXIETY 05/20/2009  . DEPRESSION/ANXIETY 03/24/2008  . DEPRESSION 05/20/2009  . INSOMNIA-SLEEP DISORDER-UNSPEC 03/24/2008  . PSORIASIS 03/24/2008  . SKIN RASH 08/13/2009  . Seizure     Past Surgical History  Procedure Date  . S/p uterine polyp 2010    Family History  Problem Relation Age of Onset  . Diabetes Mother   . Anxiety disorder Mother   . Depression Father   . Diabetes Other     History  Substance Use Topics  . Smoking status: Current Everyday Smoker  .  Smokeless tobacco: Not on file  . Alcohol Use: Yes     rare    OB History    Grav Para Term Preterm Abortions TAB SAB Ect Mult Living                  Review of Systems  Constitutional: Positive for appetite change. Negative for fever.  Gastrointestinal: Positive for nausea, abdominal pain and constipation. Negative for vomiting and diarrhea.  Genitourinary: Positive for dysuria. Negative for urgency, frequency, vaginal bleeding and vaginal discharge.  All other systems reviewed and are negative.    Allergies  Review of patient's allergies indicates no known allergies.  Home Medications   Current Outpatient Rx  Name Route Sig Dispense Refill  . ADALIMUMAB 40 MG/0.8ML [place] KIT Subcutaneous Inject 0.8 mLs (40 mg total) into the skin every 14 (fourteen) days. 1 each 2  . ALPRAZOLAM 0.5 MG PO TABS Oral Take 1 tablet (0.5 mg total) by mouth 2 (two) times daily as needed. For anxiety, to fill Feb 15, 2011 60 tablet 2  . IMMUNE SYSTEM BOOSTER PO Oral Take 1 packet by mouth once. Dissolve 1 packet in water as needed for immune support.    . OXYCODONE-ACETAMINOPHEN 5-325 MG PO TABS Oral Take 2 tablets by mouth every 4 (four) hours as needed. For pain    . TETRAHYDROZOLINE HCL 0.05 % OP SOLN Both Eyes Place 1 drop into both eyes daily. As needed for pain/dryness.      BP 138/78  Pulse 92  Temp(Src) 98.7 F (37.1 C) (Oral)  Resp 16  SpO2 100%  LMP 03/27/2011  Physical Exam  Nursing note and vitals reviewed. Constitutional: She is oriented to person, place, and time. She appears well-developed and well-nourished.  HENT:  Head: Normocephalic and atraumatic.  Neck: Neck supple.  Cardiovascular: Normal rate, regular rhythm and normal heart sounds.   Pulmonary/Chest: Breath sounds normal. No respiratory distress. She has no wheezes. She has no rales. She exhibits no tenderness.  Abdominal: Soft. Bowel sounds are normal. She exhibits no distension and no mass. There is generalized  tenderness. There is no rebound and no guarding.       Diffuse tenderness, worst in LLQ, suprapubic area.    Genitourinary: Rectal exam shows internal hemorrhoid and tenderness. Rectal exam shows no fissure and anal tone normal.  Musculoskeletal: She exhibits no edema.  Neurological: She is alert and oriented to person, place, and time.  Skin: She is not diaphoretic.  Psychiatric: Her behavior is normal. Judgment and thought content normal. Her mood appears anxious.    ED Course  Procedures (including critical care time)  Labs Reviewed  CBC - Abnormal; Notable for the following:    Hemoglobin 15.1 (*)    All other components within normal limits  DIFFERENTIAL - Abnormal; Notable for the following:    Lymphs Abs 4.5 (*)    All other components within normal limits  COMPREHENSIVE METABOLIC PANEL - Abnormal; Notable for the following:    Total Bilirubin 0.2 (*)    All other components within normal limits  LIPASE, BLOOD - Abnormal; Notable for the following:    Lipase 62 (*)    All other components within normal limits  URINALYSIS, ROUTINE W REFLEX MICROSCOPIC  PREGNANCY, URINE  OCCULT BLOOD, POC DEVICE   Ct Abdomen Pelvis W Contrast  04/05/2011  *RADIOLOGY REPORT*  Clinical Data: Right lower quadrant pain for 3 months.  Nausea. History of prior ovarian cyst removal.  CT ABDOMEN AND PELVIS WITH CONTRAST  Technique:  Multidetector CT imaging of the abdomen and pelvis was performed following the standard protocol during bolus administration of intravenous contrast.  Contrast:  100 ml Omnipaque-300.  Comparison: CT abdomen and pelvis 03/02/2011.  Findings: Mild atelectasis is seen in the lung bases.  No pleural or pericardial effusion.  The gallbladder, liver, biliary tree, adrenal glands, spleen, pancreas and kidneys all appear normal.  The appendix is well visualized and normal in appearance.  The stomach and small and large bowel are unremarkable.  There is no lymphadenopathy or fluid.   The patient has a new small left ovarian cyst measuring 2.0 cm consistent with a functional cyst.  Uterus and right ovary are unremarkable.  No focal bony abnormality.  IMPRESSION: Negative examination.  Specifically, the appendix is normal.  Original Report Authenticated By: Bernadene Bell. D'ALESSIO, M.D.     1. Abdominal pain   2. Internal hemorrhoid       MDM  Patient was over one month of abdominal pain.  Patient was seen in the emergency department last month for the same with  normal workup.  In addition to previous symptoms.  Patient now with hematochezia, which is most likely from her internal hemorrhoids.  Patient discharged home with pain and nausea medication with gastroenterology followup.  I have discussed the findings and plan with patient.  Her mother and her grandmother (with patient's permission).  All verbalize understanding and agreement with plan.          Dillard Cannon [place], [place] 04/05/11 1428

## 2011-04-05 NOTE — Discharge Instructions (Signed)
Please read the information below.  Please drink extra water over the next few days.  Call the gastroenterologist listed above for a followup appointment.  You may return to the ER at any time for worsening condition or any new symptoms that concern you.   Abdominal Pain Abdominal pain can be caused by many things. Your caregiver decides the seriousness of your pain by an examination and possibly blood tests and X-rays. Many cases can be observed and treated at home. Most abdominal pain is not caused by a disease and will probably improve without treatment. However, in many cases, more time must pass before a clear cause of the pain can be found. Before that point, it may not be known if you need more testing, or if hospitalization or surgery is needed. HOME CARE INSTRUCTIONS   Do not take laxatives unless directed by your caregiver.   Take pain medicine only as directed by your caregiver.   Only take over-the-counter or prescription medicines for pain, discomfort, or fever as directed by your caregiver.   Try a clear liquid diet (broth, tea, or water) for as long as directed by your caregiver. Slowly move to a bland diet as tolerated.  SEEK IMMEDIATE MEDICAL CARE IF:   The pain does not go away.   You have a fever.   You keep throwing up (vomiting).   The pain is felt only in portions of the abdomen. Pain in the right side could possibly be appendicitis. In an adult, pain in the left lower portion of the abdomen could be colitis or diverticulitis.   You pass bloody or black tarry stools.  MAKE SURE YOU:   Understand these instructions.   Will watch your condition.   Will get help right away if you are not doing well or get worse.  Document Released: 10/06/2004 Document Revised: 12/16/2010 Document Reviewed: 08/15/2007 [facility] Patient Information 2012 [place], [place].   Hemorrhoids Hemorrhoids are enlarged (dilated) veins around the rectum. There are 2 types of hemorrhoids, and  the type of hemorrhoid is determined by its location. Internal hemorrhoids occur in the veins just inside the rectum.They are usually not painful, but they may bleed.However, they may poke through to the outside and become irritated and painful. External hemorrhoids involve the veins outside the anus and can be felt as a painful swelling or hard lump near the anus.They are often itchy and may crack and bleed. Sometimes clots will form in the veins. This makes them swollen and painful. These are called thrombosed hemorrhoids. CAUSES Causes of hemorrhoids include:  Pregnancy. This increases the pressure in the hemorrhoidal veins.   Constipation.   Straining to have a bowel movement.   Obesity.   Heavy lifting or other activity that caused you to strain.  TREATMENT Most of the time hemorrhoids improve in 1 to 2 weeks. However, if symptoms do not seem to be getting better or if you have a lot of rectal bleeding, your caregiver may perform a procedure to help make the hemorrhoids get smaller or remove them completely.Possible treatments include:  Rubber band ligation. A rubber band is placed at the base of the hemorrhoid to cut off the circulation.   Sclerotherapy. A chemical is injected to shrink the hemorrhoid.   Infrared light therapy. Tools are used to burn the hemorrhoid.   Hemorrhoidectomy. This is surgical removal of the hemorrhoid.  HOME CARE INSTRUCTIONS   Increase fiber in your diet. Ask your caregiver about using fiber supplements.   Drink enough  water and fluids to keep your urine clear or pale yellow.   Exercise regularly.   Go to the bathroom when you have the urge to have a bowel movement. Do not wait.   Avoid straining to have bowel movements.   Keep the anal area dry and clean.   Only take over-the-counter or prescription medicines for pain, discomfort, or fever as directed by your caregiver.  If your hemorrhoids are thrombosed:  Take warm sitz baths for 20 to  30 minutes, 3 to 4 times per day.   If the hemorrhoids are very tender and swollen, place ice packs on the area as tolerated. Using ice packs between sitz baths may be helpful. Fill a plastic bag with ice. Place a towel between the bag of ice and your skin.   Medicated creams and suppositories may be used or applied as directed.   Do not use a donut-shaped pillow or sit on the toilet for long periods. This increases blood pooling and pain.  SEEK MEDICAL CARE IF:   You have increasing pain and swelling that is not controlled with your medicine.   You have uncontrolled bleeding.   You have difficulty or you are unable to have a bowel movement.   You have pain or inflammation outside the area of the hemorrhoids.   You have chills or an oral temperature above 102 F (38.9 C).  MAKE SURE YOU:   Understand these instructions.   Will watch your condition.   Will get help right away if you are not doing well or get worse.  Document Released: 12/25/1999 Document Revised: 12/16/2010 Document Reviewed: 05/01/2007 [employer] Patient Information 2012 [employer], [facility]  Fiber Content in Foods Drinking plenty of fluids and consuming foods high in fiber can help with constipation. See the list below for the fiber content of some common foods. Starches and Grains / Dietary Fiber (g)  Cheerios, 1 cup / 3 g   Kellogg's Corn Flakes, 1 cup / 0.7 g   Rice Krispies, 1  cup / 0.3 g   Quaker Oat Life Cereal,  cup / 2.1 g   Oatmeal, instant (cooked),  cup / 2 g   Kellogg's Frosted Mini Wheats, 1 cup / 5.1 g   Rice, brown, long-grain (cooked), 1 cup / 3.5 g   Rice, white, long-grain (cooked), 1 cup / 0.6 g   Macaroni, cooked, enriched, 1 cup / 2.5 g  Legumes / Dietary Fiber (g)  Beans, baked, canned, plain or vegetarian,  cup / 5.2 g   Beans, kidney, canned,  cup / 6.8 g   Beans, pinto, dried (cooked),  cup / 7.7 g   Beans, pinto, canned,  cup / 5.5 g  Breads and Crackers / Dietary  Fiber (g)  Graham crackers, plain or honey, 2 squares / 0.7 g   Saltine crackers, 3 squares / 0.3 g   Pretzels, plain, salted, 10 pieces / 1.8 g   Bread, whole-wheat, 1 slice / 1.9 g   Bread, white, 1 slice / 0.7 g   Bread, raisin, 1 slice / 1.2 g   Bagel, plain, 3 oz / 2 g   Tortilla, flour, 1 oz / 0.9 g   Tortilla, corn, 1 small / 1.5 g   Bun, hamburger or hotdog, 1 small / 0.9 g  Fruits / Dietary Fiber (g)  Apple, raw with skin, 1 medium / 4.4 g   Applesauce, sweetened,  cup / 1.5 g   Banana,  medium / 1.5 g  Grapes, 10 grapes / 0.4 g   Orange, 1 small / 2.3 g   Raisin, 1.5 oz / 1.6 g   Melon, 1 cup / 1.4 g  Vegetables / Dietary Fiber (g)  Green beans, canned,  cup / 1.3 g   Carrots (cooked),  cup / 2.3 g   Broccoli (cooked),  cup / 2.8 g   Peas, frozen (cooked),  cup / 4.4 g   Potatoes, mashed,  cup / 1.6 g   Lettuce, 1 cup / 0.5 g   Corn, canned,  cup / 1.6 g   Tomato,  cup / 1.1 g  Document Released: 05/15/2006 Document Revised: 12/16/2010 Document Reviewed: 07/10/2006 [facility] Patient Information 2012 [place], [place].Marland Kitchen

## 2011-04-05 NOTE — ED Notes (Signed)
Pt expressed interest of AMA.

## 2011-04-06 NOTE — ED Provider Notes (Signed)
Medical screening examination/treatment/procedure(s) were performed by non-physician practitioner and as supervising physician I was immediately available for consultation/collaboration.   Laray Anger, DO 04/06/11 [phone]

## 2011-04-11 ENCOUNTER — Ambulatory Visit: Payer: BC Managed Care – PPO | Admitting: Internal Medicine

## 2011-04-11 DIAGNOSIS — Z0289 Encounter for other administrative examinations: Secondary | ICD-10-CM

## 2011-04-25 ENCOUNTER — Ambulatory Visit: Payer: BC Managed Care – PPO | Admitting: Internal Medicine

## 2011-04-25 DIAGNOSIS — Z0289 Encounter for other administrative examinations: Secondary | ICD-10-CM

## 2011-04-28 NOTE — ED Provider Notes (Signed)
Medical screening examination/treatment/procedure(s) were performed by non-physician practitioner and as supervising physician I was immediately available for consultation/collaboration.  Flint Melter, MD 04/28/11 [phone]

## 2011-05-10 ENCOUNTER — Other Ambulatory Visit: Payer: Self-pay

## 2011-05-10 MED ORDER — ALPRAZOLAM 0.5 MG PO TABS: 0.5000 mg | ORAL_TABLET | Freq: Two times a day (BID) | ORAL | Status: DC | PRN

## 2011-05-10 NOTE — Telephone Encounter (Signed)
Done hardcopy to [facility]  

## 2011-05-10 NOTE — Telephone Encounter (Signed)
Faxed hardcopy to pharmacy. 

## 2011-05-19 ENCOUNTER — Encounter (HOSPITAL_COMMUNITY): Payer: Self-pay | Admitting: Family Medicine

## 2011-05-19 ENCOUNTER — Emergency Department (HOSPITAL_COMMUNITY)
Admission: EM | Admit: 2011-05-19 | Discharge: 2011-05-19 | Disposition: A | Discharge status: Home / Self Care | Payer: BC Managed Care – PPO | Attending: Emergency Medicine | Admitting: Emergency Medicine

## 2011-05-19 DIAGNOSIS — F341 Dysthymic disorder: Secondary | ICD-10-CM | POA: Insufficient documentation

## 2011-05-19 DIAGNOSIS — R300 Dysuria: Secondary | ICD-10-CM | POA: Insufficient documentation

## 2011-05-19 DIAGNOSIS — R109 Unspecified abdominal pain: Secondary | ICD-10-CM | POA: Insufficient documentation

## 2011-05-19 LAB — URINALYSIS, ROUTINE W REFLEX MICROSCOPIC
Glucose, UA: NEGATIVE mg/dL
Leukocytes, UA: NEGATIVE
Nitrite: NEGATIVE
Specific Gravity, Urine: 1.015 (ref 1.005–1.030)
pH: 6 (ref 5.0–8.0)

## 2011-05-19 LAB — CBC
HCT: 40.6 % (ref 36.0–46.0)
Hemoglobin: 13.7 g/dL (ref 12.0–15.0)
RBC: 4.38 MIL/uL (ref 3.87–5.11)
WBC: 7.5 10*3/uL (ref 4.0–10.5)

## 2011-05-19 LAB — COMPREHENSIVE METABOLIC PANEL
AST: 23 U/L (ref 0–37)
BUN: 8 mg/dL (ref 6–23)
CO2: 27 mEq/L (ref 19–32)
Chloride: 104 mEq/L (ref 96–112)
Creatinine, Ser: 0.56 mg/dL (ref 0.50–1.10)
GFR calc non Af Amer: >90 mL/min (ref >90)
Glucose, Bld: 86 mg/dL (ref 70–99)
Total Bilirubin: 0.1 mg/dL — ABNORMAL LOW (ref 0.3–1.2)

## 2011-05-19 LAB — DIFFERENTIAL
Basophils Absolute: 0 10*3/uL (ref 0.0–0.1)
Lymphocytes Relative: 51 % — ABNORMAL HIGH (ref 12–46)
Lymphs Abs: 3.9 10*3/uL (ref 0.7–4.0)
Monocytes Absolute: 0.6 10*3/uL (ref 0.1–1.0)
Monocytes Relative: 8 % (ref 3–12)
Neutro Abs: 3 10*3/uL (ref 1.7–7.7)

## 2011-05-19 MED ORDER — OXYCODONE-ACETAMINOPHEN 5-325 MG PO TABS
1.0000 | ORAL_TABLET | Freq: Once | ORAL | Status: AC
  Administered 2011-05-19: 1 via ORAL
  Filled 2011-05-19: qty 1

## 2011-05-19 MED ORDER — PHENAZOPYRIDINE HCL 200 MG PO TABS: 100.0000 mg | ORAL_TABLET | Freq: Three times a day (TID) | ORAL | Status: AC | PRN

## 2011-05-19 MED ORDER — OXYCODONE-ACETAMINOPHEN 5-325 MG PO TABS: 1.0000 | ORAL_TABLET | Freq: Four times a day (QID) | ORAL | Status: AC | PRN

## 2011-05-19 MED ORDER — PHENAZOPYRIDINE HCL 200 MG PO TABS
200.0000 mg | ORAL_TABLET | Freq: Three times a day (TID) | ORAL | Status: DC
  Administered 2011-05-19: 200 mg via ORAL

## 2011-05-19 MED ORDER — PHENAZOPYRIDINE HCL 200 MG PO TABS
ORAL_TABLET | ORAL | Status: AC
  Administered 2011-05-19: 23:00:00
  Filled 2011-05-19: qty 1

## 2011-05-19 MED ORDER — ONDANSETRON 4 MG PO TBDP
4.0000 mg | ORAL_TABLET | Freq: Once | ORAL | Status: AC
  Administered 2011-05-19: 4 mg via ORAL
  Filled 2011-05-19: qty 1

## 2011-05-19 MED ORDER — DICYCLOMINE HCL 20 MG PO TABS: 20.0000 mg | ORAL_TABLET | Freq: Two times a day (BID) | ORAL | Status: DC

## 2011-05-19 NOTE — ED Provider Notes (Signed)
History     CSN: [id number]  Arrival date & time 05/19/11  4098   First MD Initiated Contact with Patient 05/19/11 2055      Chief Complaint  Patient presents with  . Abdominal Pain    (Consider location/radiation/quality/duration/timing/severity/associated sxs/prior treatment) HPI Comments: This is a 31 year old, Caucasian female with a history of chronic abdominal pain for the past 5-6 months.  She has had several CT scans, which were all, normal.  She's had a normal ultrasound, normal.  Blood work, normal.  Urine, despite her complaints, numerous times of dysuria.  She is currently on her menstrual cycle.  She states that for 5 days ago.  She had dysuria, and she took a friend's Cipro for her UTI symptoms, as well as is this has not significantly decreased her discomfort.  She states she is having so much pelvic discomfort she cannot insert a tampon.  There is no history of STD IUD views recent pregnancy.  Therapeutic abortion abnormal cells on Pap test.  She has been told in the past that she has an internal hemorrhoid and she has noticed that with bowel movements  she sees a small amount of blood  Patient is a 31 y.o. female presenting with abdominal pain. The history is provided by the patient.  Abdominal Pain The primary symptoms of the illness include abdominal pain, dysuria and vaginal bleeding. The primary symptoms of the illness do not include fever, fatigue, shortness of breath, nausea, diarrhea, hematemesis or vaginal discharge. The current episode started more than 2 days ago. The onset of the illness was gradual.  The abdominal pain began more than 2 days ago. The pain came on gradually. The abdominal pain has been gradually worsening since its onset. The abdominal pain is generalized. The severity of the abdominal pain is 10/10.  The dysuria began more than 1 week ago. The discomfort is severe. The dysuria is associated with vaginal pain. The dysuria is not associated with  discharge, hematuria, frequency, urgency or dyspareunia.  The patient states that she believes she is currently not pregnant. The patient has not had a change in bowel habit. Symptoms associated with the illness do not include urgency, hematuria, frequency or back pain.    Past Medical History  Diagnosis Date  . ANXIETY 05/20/2009  . DEPRESSION/ANXIETY 03/24/2008  . DEPRESSION 05/20/2009  . INSOMNIA-SLEEP DISORDER-UNSPEC 03/24/2008  . PSORIASIS 03/24/2008  . SKIN RASH 08/13/2009  . Seizure     Past Surgical History  Procedure Date  . S/p uterine polyp 2010  . Ovarian cyst removal     Family History  Problem Relation Age of Onset  . Diabetes Mother   . Anxiety disorder Mother   . Depression Father   . Diabetes Other     History  Substance Use Topics  . Smoking status: Current Everyday Smoker -- 0.5 packs/day  . Smokeless tobacco: Not on file  . Alcohol Use: Yes     daily drinker    OB History    Grav Para Term Preterm Abortions TAB SAB Ect Mult Living                  Review of Systems  Constitutional: Negative for fever and fatigue.  Respiratory: Negative for shortness of breath.   Gastrointestinal: Positive for abdominal pain. Negative for nausea, diarrhea and hematemesis.  Genitourinary: Positive for dysuria, vaginal bleeding and vaginal pain. Negative for urgency, frequency, hematuria, vaginal discharge and dyspareunia.  Musculoskeletal: Negative for back pain.  Neurological:  Negative for dizziness and tremors.  Psychiatric/Behavioral: The patient is nervous/anxious.     Allergies  Review of patient's allergies indicates no known allergies.  Home Medications   Current Outpatient Rx  Name Route Sig Dispense Refill  . ADALIMUMAB 40 MG/0.8ML [place] KIT Subcutaneous Inject 0.8 mLs (40 mg total) into the skin every 14 (fourteen) days. 1 each 2  . ALPRAZOLAM 0.5 MG PO TABS Oral Take 1 tablet (0.5 mg total) by mouth 2 (two) times daily as needed for anxiety. 60 tablet 1      Please make return office visit for further refill ...  . TETRAHYDROZOLINE HCL 0.05 % OP SOLN Both Eyes Place 1 drop into both eyes daily. As needed for pain/dryness.    Marland Kitchen DICYCLOMINE HCL 20 MG PO TABS Oral Take 1 tablet (20 mg total) by mouth 2 (two) times daily. 20 tablet 0  . OXYCODONE-ACETAMINOPHEN 5-325 MG PO TABS Oral Take 1 tablet by mouth every 6 (six) hours as needed for pain. 20 tablet 0  . PHENAZOPYRIDINE HCL 200 MG PO TABS Oral Take 1 tablet (200 mg total) by mouth 3 (three) times daily as needed for pain. 10 tablet 0    BP 117/84  Pulse 104  Temp(Src) 98.1 F (36.7 C) (Oral)  Resp 22  SpO2 100%  LMP 05/19/2011  Physical Exam  Constitutional: She is oriented to person, place, and time. She appears well-developed and well-nourished.  HENT:  Head: Normocephalic.  Eyes: Pupils are equal, round, and reactive to light.  Neck: Normal range of motion.  Cardiovascular: Normal rate.   Pulmonary/Chest: Effort normal.  Abdominal: She exhibits distension. There is no hepatosplenomegaly. There is generalized tenderness. There is no rebound.  Genitourinary:       Refill use.  Pelvic exam do, to discomfort, denies discharge.  Currently having her menses, which is normal time frame  Musculoskeletal: Normal range of motion.  Neurological: She is alert and oriented to person, place, and time.  Skin: Skin is warm and dry. No rash noted. No pallor.    ED Course  Procedures (including critical care time)  Labs Reviewed  DIFFERENTIAL - Abnormal; Notable for the following:    Neutrophils Relative 40 (*)    Lymphocytes Relative 51 (*)    All other components within normal limits  COMPREHENSIVE METABOLIC PANEL - Abnormal; Notable for the following:    Calcium 8.1 (*)    Total Bilirubin 0.1 (*)    All other components within normal limits  CBC  URINALYSIS, ROUTINE W REFLEX MICROSCOPIC   No results found.   1. Abdominal pain   2. Dysuria       MDM  After last ED exam  patient was referred to gastroenterology.  She did not follow up with them as she does not believe that this is a GI problem.  She states that she has called her OB/GYN and is told her it was not "a female.  Problem.  After extensive questioning, I feel that this may be an interstitial cystitis.  Will check urine, CBC i-STAT, and refer patient to urology for further evaluation Discussed lab values.  The importance of followup with urology        Arman Filter, NP 05/19/11 2316

## 2011-05-19 NOTE — Discharge Instructions (Signed)
Abdominal Pain (Nonspecific) Your exam might not show the exact reason you have abdominal pain. Since there are many different causes of abdominal pain, another checkup and more tests may be needed. It is very important to follow up for lasting (persistent) or worsening symptoms. A possible cause of abdominal pain in any person who still has his or her appendix is acute appendicitis. Appendicitis is often hard to diagnose. Normal blood tests, urine tests, ultrasound, and CT scans do not completely rule out early appendicitis or other causes of abdominal pain. Sometimes, only the changes that happen over time will allow appendicitis and other causes of abdominal pain to be determined. Other potential problems that may require surgery may also take time to become more apparent. Because of this, it is important that you follow all of the instructions below. HOME CARE INSTRUCTIONS   Rest as much as possible.   Do not eat solid food until your pain is gone.   While adults or children have pain: A diet of water, weak decaffeinated tea, broth or bouillon, gelatin, oral rehydration solutions (ORS), frozen ice pops, or ice chips may be helpful.   When pain is gone in adults or children: Start a light diet (dry toast, crackers, applesauce, or white rice). Increase the diet slowly as long as it does not bother you. Eat no dairy products (including cheese and eggs) and no spicy, fatty, fried, or high-fiber foods.   Use no alcohol, caffeine, or cigarettes.   Take your regular medicines unless your caregiver told you not to.   Take any prescribed medicine as directed.   Only take over-the-counter or prescription medicines for pain, discomfort, or fever as directed by your caregiver. Do not give aspirin to children.  If your caregiver has given you a follow-up appointment, it is very important to keep that appointment. Not keeping the appointment could result in a permanent injury and/or lasting (chronic) pain  and/or disability. If there is any problem keeping the appointment, you must call to reschedule.  SEEK IMMEDIATE MEDICAL CARE IF:   Your pain is not gone in 24 hours.   Your pain becomes worse, changes location, or feels different.   You or your child has an oral temperature above 102 F (38.9 C), not controlled by medicine.   Your baby is older than 3 months with a rectal temperature of 102 F (38.9 C) or higher.   Your baby is 103 months old or younger with a rectal temperature of 100.4 F (38 C) or higher.   You have shaking chills.   You keep throwing up (vomiting) or cannot drink liquids.   There is blood in your vomit or you see blood in your bowel movements.   Your bowel movements become dark or black.   You have frequent bowel movements.   Your bowel movements stop (become blocked) or you cannot pass gas.   You have bloody, frequent, or painful urination.   You have yellow discoloration in the skin or whites of the eyes.   Your stomach becomes bloated or bigger.   You have dizziness or fainting.   You have chest or back pain.  MAKE SURE YOU:   Understand these instructions.   Will watch your condition.   Will get help right away if you are not doing well or get worse.  Document Released: 12/27/2004 Document Revised: 12/16/2010 Document Reviewed: 11/24/2008 [facility] Patient Information 2012 [place], [place]. Speak with the urologist about intersitial cystitis

## 2011-05-19 NOTE — ED Provider Notes (Signed)
Medical screening examination/treatment/procedure(s) were performed by non-physician practitioner and as supervising physician I was immediately available for consultation/collaboration.  Celene Kras, MD 05/19/11 [phone]

## 2011-05-19 NOTE — ED Notes (Addendum)
Patient states that she started having lower abdominal pressure 3-4 days ago. Has been taking Azo (on her 2nd box) and took friend's Cipro for 5 days.  States having lower back pain. Also c/o vaginal pain and pressure.

## 2011-05-19 NOTE — ED Notes (Signed)
Pt. With diffuse abdominal pain for 3 months.  Pt. Is on her period now and describes pain as an internal pain which is constant and sharp and she is rating it as a 10.  Reports she is reluctant to urinate because it hurts when she does internally.  Denies vomiting or diarrhea but reports a slight amount of nausea.

## 2011-05-19 NOTE — ED Notes (Signed)
Pt dc/d .  No distress noted

## 2011-06-10 ENCOUNTER — Encounter (HOSPITAL_COMMUNITY): Payer: Self-pay

## 2011-06-10 ENCOUNTER — Emergency Department (HOSPITAL_COMMUNITY)
Admission: EM | Admit: 2011-06-10 | Discharge: 2011-06-10 | Disposition: A | Discharge status: Home / Self Care | Payer: BC Managed Care – PPO | Attending: Emergency Medicine | Admitting: Emergency Medicine

## 2011-06-10 ENCOUNTER — Encounter (HOSPITAL_COMMUNITY): Payer: Self-pay | Admitting: Emergency Medicine

## 2011-06-10 ENCOUNTER — Emergency Department (HOSPITAL_COMMUNITY)
Admission: EM | Admit: 2011-06-10 | Discharge: 2011-06-11 | Disposition: A | Discharge status: Home / Self Care | Payer: BC Managed Care – PPO | Attending: Emergency Medicine | Admitting: Emergency Medicine

## 2011-06-10 DIAGNOSIS — W108XXA Fall (on) (from) other stairs and steps, initial encounter: Secondary | ICD-10-CM | POA: Insufficient documentation

## 2011-06-10 DIAGNOSIS — Y9239 Other specified sports and athletic area as the place of occurrence of the external cause: Secondary | ICD-10-CM | POA: Insufficient documentation

## 2011-06-10 DIAGNOSIS — F341 Dysthymic disorder: Secondary | ICD-10-CM | POA: Insufficient documentation

## 2011-06-10 DIAGNOSIS — Y92838 Other recreation area as the place of occurrence of the external cause: Secondary | ICD-10-CM | POA: Insufficient documentation

## 2011-06-10 DIAGNOSIS — S0990XA Unspecified injury of head, initial encounter: Secondary | ICD-10-CM

## 2011-06-10 DIAGNOSIS — F411 Generalized anxiety disorder: Secondary | ICD-10-CM | POA: Insufficient documentation

## 2011-06-10 DIAGNOSIS — Y92009 Unspecified place in unspecified non-institutional (private) residence as the place of occurrence of the external cause: Secondary | ICD-10-CM | POA: Insufficient documentation

## 2011-06-10 DIAGNOSIS — F1010 Alcohol abuse, uncomplicated: Secondary | ICD-10-CM | POA: Insufficient documentation

## 2011-06-10 DIAGNOSIS — F3289 Other specified depressive episodes: Secondary | ICD-10-CM | POA: Insufficient documentation

## 2011-06-10 DIAGNOSIS — F17200 Nicotine dependence, unspecified, uncomplicated: Secondary | ICD-10-CM | POA: Insufficient documentation

## 2011-06-10 DIAGNOSIS — F10929 Alcohol use, unspecified with intoxication, unspecified: Secondary | ICD-10-CM

## 2011-06-10 DIAGNOSIS — F329 Major depressive disorder, single episode, unspecified: Secondary | ICD-10-CM | POA: Insufficient documentation

## 2011-06-10 DIAGNOSIS — T424X4A Poisoning by benzodiazepines, undetermined, initial encounter: Secondary | ICD-10-CM | POA: Insufficient documentation

## 2011-06-10 DIAGNOSIS — F1910 Other psychoactive substance abuse, uncomplicated: Secondary | ICD-10-CM | POA: Insufficient documentation

## 2011-06-10 DIAGNOSIS — T424X1A Poisoning by benzodiazepines, accidental (unintentional), initial encounter: Secondary | ICD-10-CM | POA: Insufficient documentation

## 2011-06-10 LAB — URINALYSIS, MICROSCOPIC ONLY
Ketones, ur: NEGATIVE mg/dL
Leukocytes, UA: NEGATIVE
Nitrite: NEGATIVE
pH: 5.5 (ref 5.0–8.0)

## 2011-06-10 LAB — BASIC METABOLIC PANEL
CO2: 24 mEq/L (ref 19–32)
Chloride: 108 mEq/L (ref 96–112)
GFR calc non Af Amer: >90 mL/min (ref >90)
Glucose, Bld: 98 mg/dL (ref 70–99)
Potassium: 3.7 mEq/L (ref 3.5–5.1)
Sodium: 143 mEq/L (ref 135–145)

## 2011-06-10 LAB — CBC
Hemoglobin: 13.3 g/dL (ref 12.0–15.0)
MCHC: 33.4 g/dL (ref 30.0–36.0)
RBC: 4.26 MIL/uL (ref 3.87–5.11)
WBC: 6.6 10*3/uL (ref 4.0–10.5)

## 2011-06-10 LAB — RAPID URINE DRUG SCREEN, HOSP PERFORMED
Barbiturates: NOT DETECTED
Benzodiazepines: POSITIVE — AB
Cocaine: NOT DETECTED
Tetrahydrocannabinol: POSITIVE — AB

## 2011-06-10 LAB — SALICYLATE LEVEL: Salicylate Lvl: <2 mg/dL — ABNORMAL LOW (ref 2.8–20.0)

## 2011-06-10 MED ORDER — SODIUM CHLORIDE 0.9 % IV SOLN
Freq: Once | INTRAVENOUS | Status: AC
  Administered 2011-06-10: 06:00:00 via INTRAVENOUS

## 2011-06-10 NOTE — ED Provider Notes (Signed)
History     CSN: [id number]  Arrival date & time 06/10/11  1478   First MD Initiated Contact with Patient 06/10/11 0445      Chief Complaint  Patient presents with  . Drug Overdose     History provided by: The boyfriend. History Limited By: Somnolence.   the boyfriend reports that the patient had been drinking most of the day by the pool.  She continued to drink daily name Hannon at some point her in the boyfriend got in argument regarding her Xanax as the patient wanted to take her Xanax.  The port was concern she taken to much alcohol and to be taking her Xanax.  A fight ensued and it sounds as the Xanax follow the ground.  The patient ingested some amount of Xanax of which the actual quantity is unknown.  She also took some Motrin p.m. approximately 5-6 pills.  She has no prior history of suicide attempts but she does have a history of depression and anxiety.  It is also reported that she drink alcohol every day but as of recently the patient has not desired any help with her alcohol abuse issues.  It unclear if this was an overt suicide attempt.  Past Medical History  Diagnosis Date  . ANXIETY 05/20/2009  . DEPRESSION/ANXIETY 03/24/2008  . DEPRESSION 05/20/2009  . INSOMNIA-SLEEP DISORDER-UNSPEC 03/24/2008  . PSORIASIS 03/24/2008  . SKIN RASH 08/13/2009  . Seizure     Past Surgical History  Procedure Date  . S/p uterine polyp 2010  . Ovarian cyst removal     Family History  Problem Relation Age of Onset  . Diabetes Mother   . Anxiety disorder Mother   . Depression Father   . Diabetes Other     History  Substance Use Topics  . Smoking status: Current Everyday Smoker -- 0.5 packs/day  . Smokeless tobacco: Not on file  . Alcohol Use: 1.8 oz/week    2 Cans of beer, 1 Shots of liquor per week     daily drinker    OB History    Grav Para Term Preterm Abortions TAB SAB Ect Mult Living                  Review of Systems  Unable to perform ROS   Allergies  Review of  patient's allergies indicates no known allergies.  Home Medications   Current Outpatient Rx  Name Route Sig Dispense Refill  . ADALIMUMAB 40 MG/0.8ML  KIT Subcutaneous Inject 0.8 mLs (40 mg total) into the skin every 14 (fourteen) days. 1 each 2  . ALPRAZOLAM 0.5 MG PO TABS Oral Take 1 tablet (0.5 mg total) by mouth 2 (two) times daily as needed for anxiety. 60 tablet 1    Please make return office visit for further refill ...  . DICYCLOMINE HCL 20 MG PO TABS Oral Take 1 tablet (20 mg total) by mouth 2 (two) times daily. 20 tablet 0  . TETRAHYDROZOLINE HCL 0.05 % OP SOLN Both Eyes Place 1 drop into both eyes daily. As needed for pain/dryness.      BP 96/55  Pulse 75  Temp(Src) 99.2 F (37.3 C) (Oral)  Resp 15  SpO2 100%  LMP 05/25/2011  Physical Exam  Nursing note and vitals reviewed. Constitutional: She appears well-developed and well-nourished. No distress.  HENT:  Head: Normocephalic and atraumatic.  Eyes: EOM are normal.  Neck: Normal range of motion.  Cardiovascular: Normal rate, regular rhythm and normal heart sounds.  Pulmonary/Chest: Effort normal and breath sounds normal.  Abdominal: Soft. She exhibits no distension. There is no tenderness.  Musculoskeletal: Normal range of motion.  Neurological:       Awakes to sternal rub.  Slurred speech.  Follow simple commands.  Skin: Skin is warm and dry.  Psychiatric: She has a normal mood and affect. Judgment normal.    ED Course  Procedures (including critical care time)  Labs Reviewed  SALICYLATE LEVEL - Abnormal; Notable for the following:    Salicylate Lvl <2.0 (*)    All other components within normal limits  URINALYSIS, WITH MICROSCOPIC - Abnormal; Notable for the following:    Squamous Epithelial / LPF FEW (*)    All other components within normal limits  URINE RAPID DRUG SCREEN (HOSP PERFORMED) - Abnormal; Notable for the following:    Benzodiazepines POSITIVE (*)    Tetrahydrocannabinol POSITIVE (*)     All other components within normal limits  ETHANOL - Abnormal; Notable for the following:    Alcohol, Ethyl (B) 273 (*)    All other components within normal limits  CBC  BASIC METABOLIC PANEL  ACETAMINOPHEN LEVEL  PREGNANCY, URINE   No results found.   No diagnosis found.    MDM  The pt will need to awaken him sober up more before decisions can be made regarding her disposition.  Care to Dr. Lynelle Doctor.  She's protecting her airway.  She is arousable         Lyanne Co, MD 06/10/11 [phone]

## 2011-06-10 NOTE — ED Notes (Signed)
PT. SLIPPED AND FELL AT HOME ( STAIRS ) THIS EVENING , BRIEF LOC , REPORTS PAIN AT BACK OF HEAD , ADMITS ETOH THIS EVENING .

## 2011-06-10 NOTE — ED Notes (Signed)
Pt states she just wante to sleep. Denies thoughts of harming self. Pt c/o pain from UTI. Pt with slurred speech and smells strong of ETOH.

## 2011-06-10 NOTE — ED Notes (Signed)
Pt ambulates to bathroom with assistance. Pt requesting pain medication states all over pain noted.

## 2011-06-10 NOTE — ED Notes (Signed)
Patient on the phone with her mother. Prior to phone call, patient took BP cuff  And pulse ox off and stated, "I want this fucking stuff off." and took both off. Patient's boyfriend remains at the bedside.

## 2011-06-10 NOTE — Discharge Instructions (Signed)
Finding Treatment for Alcohol and Drug Addiction It can be hard to find the right place to get professional treatment. Here are some important things to consider:  There are different types of treatment to choose from.   Some programs are live-in (residential) while others are not (outpatient). Sometimes a combination is offered.   No single type of program is right for everyone.   Most treatment programs involve a combination of education, counseling, and a 12-step, spiritually-based approach.   There are non-spiritually based programs (not 12-step).   Some treatment programs are government sponsored. They are geared for patients without private insurance.   Treatment programs can vary in many respects such as:   Cost and types of insurance accepted.   Types of on-site medical services offered.   Length of stay, setting, and size.   Overall philosophy of treatment.  A person may need specialized treatment or have needs not addressed by all programs. For example, adolescents need treatment appropriate for their age. Other people have secondary disorders that must be managed as well. Secondary conditions can include mental illness, such as depression or diabetes. Often, a period of detoxification from alcohol or drugs is needed. This requires medical supervision and not all programs offer this. THINGS TO CONSIDER WHEN SELECTING A TREATMENT PROGRAM   Is the program certified by the appropriate government agency? Even private programs must be certified and employ certified professionals.   Does the program accept your insurance? If not, can a payment plan be set up?   Is the facility clean, organized, and well run? Do they allow you to speak with graduates who can share their treatment experience with you? Can you tour the facility? Can you meet with staff?   Does the program meet the full range of individual needs?   Does the treatment program address sexual orientation and physical  disabilities? Do they provide age, gender, and culturally appropriate treatment services?   Is treatment available in languages other than English?   Is long-term aftercare support or guidance encouraged and provided?   Is assessment of an individual's treatment plan ongoing to ensure it meets changing needs?   Does the program use strategies to encourage reluctant patients to remain in treatment long enough to increase the likelihood of success?   Does the program offer counseling (individual or group) and other behavioral therapies?   Does the program offer medicine as part of the treatment regimen, if needed?   Is there ongoing monitoring of possible relapse? Is there a defined relapse prevention program? Are services or referrals offered to family members to ensure they understand addiction and the recovery process? This would help them support the recovering individual.   Are 12-step meetings held at the center or is transport available for patients to attend outside meetings?  In countries outside of the [place]. and [place], Magazine features editor for contact information for services in your area. Document Released: 11/25/2004 Document Revised: 12/16/2010 Document Reviewed: 06/07/2007 [facility] Patient Information 2012 [place], [place].     Behavioral Health Resources in the [facility]  Intensive Outpatient Programs: [facility]      [address] [place], [place] [phone] Both a day and evening program       [facility] Outpatient     [address]        [place], [place] [address] [phone]         [facility]: [facility] [address]  [place] [phone]  [facility] Mental Health ACCESS LINE: [phone] or [phone] [address] [place], [place] [address] [email or web address]  Mobile Crisis Teams:                                        [facility]                Mobile Crisis Care Unit [phone]             [facility] [address] Ginette Otto [phone]                                         Interventionist [address] [place] [address] [place] [phone]  Self-Help/Support Groups: Mental Health Assoc[facility] of [employer] of support groups [phone] (call for more info)  Narcotics Anonymous (NA) [facility]aring [facility] [address] [place] - 2 meetings at this location  Residential Treatment Programs:  [facility]      [address]        [place]       [phone]         [facility] [address], [place] [address] [place], [place]  [address] [phone]  [facility]  [address] [place], [place] [address] [phone] Admissions: 8am-3pm M-F  [facility]     [address]        [place], [place] [address]       [phone]         [facility] [address] [place], [place] [phone]  [facility] [address] [place], [place] [phone]  [facility] - Intensive Outpatient      [address]     [place], [place]       [phone]         [facility]     [address] [place], [place] [phone] or [phone]  [facility] Services ([facility])  [address] [place], [place] [phone]  [facility] [address]                                               [address] [place] [phone]  [facility] Resources: [place] [phone]               General Therapy                                                Angie Fava, PhD        [address] [place], [place] [address]         [phone]   Insurance  [facility]   [address] [place], [place]  [address] [phone]  [facility] [address] [place], [place] [address] [phone] Insurance/Medicaid/sponsorship through [facility] and Families                                              [address]                                        [place], [place] [address]    Therapy/tele-psych/case         [phone]          [facility] [address], [place]  [address]  Adolescent/group home/case management [phone]                                           Creola Corn PhD       General therapy       Insurance   [phone]         Dr. Lolly Mustache Insurance [phone] M-F   Detox/Residential Medicaid, sponsorship [phone]

## 2011-06-10 NOTE — ED Notes (Signed)
Pt with minimal response to IV, and in out cath. Boyfriend at bedside, states pt also took Advil pm and possible laxative. [employer] at bedside for follow up on possible "overdose". Pt maintaining airway without difficultly.Pt on continuous SPO2 with close monitoring.

## 2011-06-10 NOTE — ED Provider Notes (Signed)
Pt still sleeping at last assessment approx 30 min ago.  Will reassess once she wakes up.  3:03 PM patient is awake and alert. She states she does not want inpatient treatment or to see anyone from the psychiatric team at this time. Patient states she would prefer to see her primary doctor. I offered a psychiatric evaluation today for assistance with her substance abuse problems. Patient states she will followup as an outpatient and did not want any further treatment today.  Celene Kras, MD 06/10/11 [phone]

## 2011-06-10 NOTE — ED Notes (Signed)
Per ems pts boyfriend called in reference to pt taking unknown amount of .5mg  xanax. rx filled today. 30 pills unaccounted for. Pills spilled on scene. Pts boyfriend states pt took approximately 5. Pt also c/o pain related to uti.

## 2011-06-10 NOTE — ED Notes (Signed)
Patient awake. Patient requesting pain medication for her abdomen. Patient also stating that she needs to go to work.

## 2011-06-10 NOTE — ED Notes (Signed)
WGN:[id number]<HY> Expected date:06/10/11<BR> Expected time: 2:21 AM<BR> Means of arrival:Ambulance<BR> Comments:<BR> Etoh/drugs

## 2011-06-11 ENCOUNTER — Encounter (HOSPITAL_COMMUNITY): Payer: Self-pay | Admitting: *Deleted

## 2011-06-11 ENCOUNTER — Emergency Department (HOSPITAL_COMMUNITY): Payer: BC Managed Care – PPO

## 2011-06-11 ENCOUNTER — Emergency Department (HOSPITAL_COMMUNITY)
Admission: EM | Admit: 2011-06-11 | Discharge: 2011-06-12 | Disposition: A | Discharge status: Home / Self Care | Payer: BC Managed Care – PPO | Attending: Emergency Medicine | Admitting: Emergency Medicine

## 2011-06-11 DIAGNOSIS — S0003XA Contusion of scalp, initial encounter: Secondary | ICD-10-CM | POA: Insufficient documentation

## 2011-06-11 DIAGNOSIS — F17200 Nicotine dependence, unspecified, uncomplicated: Secondary | ICD-10-CM | POA: Insufficient documentation

## 2011-06-11 DIAGNOSIS — F489 Nonpsychotic mental disorder, unspecified: Secondary | ICD-10-CM | POA: Insufficient documentation

## 2011-06-11 DIAGNOSIS — R109 Unspecified abdominal pain: Secondary | ICD-10-CM | POA: Insufficient documentation

## 2011-06-11 DIAGNOSIS — X838XXA Intentional self-harm by other specified means, initial encounter: Secondary | ICD-10-CM | POA: Insufficient documentation

## 2011-06-11 DIAGNOSIS — Z79899 Other long term (current) drug therapy: Secondary | ICD-10-CM | POA: Insufficient documentation

## 2011-06-11 DIAGNOSIS — F411 Generalized anxiety disorder: Secondary | ICD-10-CM | POA: Insufficient documentation

## 2011-06-11 LAB — CBC
Hemoglobin: 13.6 g/dL (ref 12.0–15.0)
MCV: 92.8 fL (ref 78.0–100.0)
Platelets: 296 10*3/uL (ref 150–400)
RBC: 4.3 MIL/uL (ref 3.87–5.11)
WBC: 10.1 10*3/uL (ref 4.0–10.5)

## 2011-06-11 LAB — DIFFERENTIAL
Lymphocytes Relative: 34 % (ref 12–46)
Lymphs Abs: 3.5 10*3/uL (ref 0.7–4.0)
Monocytes Relative: 8 % (ref 3–12)
Neutrophils Relative %: 56 % (ref 43–77)

## 2011-06-11 LAB — RAPID URINE DRUG SCREEN, HOSP PERFORMED
Amphetamines: NOT DETECTED
Benzodiazepines: POSITIVE — AB
Opiates: NOT DETECTED
Tetrahydrocannabinol: NOT DETECTED

## 2011-06-11 LAB — POCT I-STAT, CHEM 8
BUN: 6 mg/dL (ref 6–23)
Chloride: 113 mEq/L — ABNORMAL HIGH (ref 96–112)
HCT: 41 % (ref 36.0–46.0)
Potassium: 3.9 mEq/L (ref 3.5–5.1)

## 2011-06-11 LAB — ETHANOL: Alcohol, Ethyl (B): 393 mg/dL — ABNORMAL HIGH (ref 0–11)

## 2011-06-11 LAB — URINALYSIS, ROUTINE W REFLEX MICROSCOPIC
Bilirubin Urine: NEGATIVE
Hgb urine dipstick: NEGATIVE
Protein, ur: NEGATIVE mg/dL
Urobilinogen, UA: 0.2 mg/dL (ref 0.0–1.0)

## 2011-06-11 LAB — HEPATIC FUNCTION PANEL
ALT: 22 U/L (ref 0–35)
AST: 33 U/L (ref 0–37)
Alkaline Phosphatase: 61 U/L (ref 39–117)
Bilirubin, Direct: <0.1 mg/dL (ref 0.0–0.3)
Total Bilirubin: 0.2 mg/dL — ABNORMAL LOW (ref 0.3–1.2)

## 2011-06-11 MED ORDER — ACETAMINOPHEN 325 MG PO TABS
650.0000 mg | ORAL_TABLET | Freq: Once | ORAL | Status: AC
  Administered 2011-06-11: 650 mg via ORAL
  Filled 2011-06-11: qty 2

## 2011-06-11 MED ORDER — NICOTINE 21 MG/24HR TD PT24
21.0000 mg | MEDICATED_PATCH | Freq: Every day | TRANSDERMAL | Status: DC
  Administered 2011-06-12: 21 mg via TRANSDERMAL
  Filled 2011-06-11: qty 1

## 2011-06-11 MED ORDER — IOHEXOL 300 MG/ML  SOLN
100.0000 mL | Freq: Once | INTRAMUSCULAR | Status: AC | PRN
  Administered 2011-06-11: 100 mL via INTRAVENOUS

## 2011-06-11 MED ORDER — HALOPERIDOL LACTATE 5 MG/ML IJ SOLN
2.0000 mg | Freq: Once | INTRAMUSCULAR | Status: AC
Start: 2011-06-11 — End: 2011-06-11
  Administered 2011-06-11: 2 mg via INTRAVENOUS
  Filled 2011-06-11: qty 1

## 2011-06-11 MED ORDER — ALUM & MAG HYDROXIDE-SIMETH 200-200-20 MG/5ML PO SUSP: 30.0000 mL | ORAL | Status: DC | PRN

## 2011-06-11 MED ORDER — IBUPROFEN 600 MG PO TABS: 600.0000 mg | ORAL_TABLET | Freq: Three times a day (TID) | ORAL | Status: DC | PRN

## 2011-06-11 MED ORDER — ACETAMINOPHEN 325 MG PO TABS: 650.0000 mg | ORAL_TABLET | ORAL | Status: DC | PRN

## 2011-06-11 MED ORDER — LORAZEPAM 2 MG/ML IJ SOLN
2.0000 mg | Freq: Once | INTRAMUSCULAR | Status: AC
  Administered 2011-06-11: 2 mg via INTRAVENOUS
  Filled 2011-06-11: qty 1

## 2011-06-11 MED ORDER — ONDANSETRON HCL 4 MG PO TABS: 4.0000 mg | ORAL_TABLET | Freq: Three times a day (TID) | ORAL | Status: DC | PRN

## 2011-06-11 MED ORDER — LORAZEPAM 1 MG PO TABS: 1.0000 mg | ORAL_TABLET | Freq: Three times a day (TID) | ORAL | Status: DC | PRN

## 2011-06-11 MED ORDER — SODIUM CHLORIDE 0.9 % IV SOLN
INTRAVENOUS | Status: DC
  Administered 2011-06-11: 1000 mL via INTRAVENOUS

## 2011-06-11 NOTE — BH Assessment (Signed)
Assessment Note   Katelyn Bridges is an 31 y.o. female who presented to [facility] Emergency Department with active suicidal ideations with intent to OD on Xanax pills. Pt's mother Sandrea Matte [phone]) was present during assessment. Pt would not provide eye contact or be cooperative in Clinical research associate obtaining information. Pt was observed to be a poor historian and unable to provide dates and time periods for information requested. Pt's mother reported that pt has an extensive history of alcohol abuse and overly consuming her Xanax. Per pt's mother "She's been trying to hurt herself and others. She's been abusing the Xanax since she was 18". Pt's mother reported that pt consumes alcohol daily and also uses Marijuana. Pt's mother is unsure of the exact amount of alcohol pt consumes although pt's BAL is in the 300s. Patient reported to MD that she is "unhappy" and that she "cannot do this [life] anymore". Per pt's mother, pt has provided several past gestures of attempting to hurt herself in some way. Pt's mother reported that pt has struggled with suicidal thoughts for 14 years. Pt was unable to provide writer with amounts of alcohol and Xanax usage during assessment. Pt denies current HI/AVH at this time and this writer recommends inpatient treatment for suicidal ideations.   Axis I: Anxiety Disorder NOS, Mood Disorder NOS and Polysubstance Dependence Axis II: Deferred Axis III:  Past Medical History  Diagnosis Date  . ANXIETY 05/20/2009  . DEPRESSION/ANXIETY 03/24/2008  . DEPRESSION 05/20/2009  . INSOMNIA-SLEEP DISORDER-UNSPEC 03/24/2008  . PSORIASIS 03/24/2008  . SKIN RASH 08/13/2009  . Seizure    Axis IV: other psychosocial or environmental problems and problems related to social environment Axis V: 41-50 serious symptoms  Past Medical History:  Past Medical History  Diagnosis Date  . ANXIETY 05/20/2009  . DEPRESSION/ANXIETY 03/24/2008  . DEPRESSION 05/20/2009  . INSOMNIA-SLEEP  DISORDER-UNSPEC 03/24/2008  . PSORIASIS 03/24/2008  . SKIN RASH 08/13/2009  . Seizure     Past Surgical History  Procedure Date  . S/p uterine polyp 2010  . Ovarian cyst removal     Family History:  Family History  Problem Relation Age of Onset  . Diabetes Mother   . Anxiety disorder Mother   . Depression Father   . Diabetes Other     Social History:  reports that she has been smoking Cigarettes.  She has been smoking about 1 pack per day. She does not have any smokeless tobacco history on file. She reports that she drinks about 1.8 ounces of alcohol per week. She reports that she uses illicit drugs (Marijuana).  Additional Social History:  Alcohol / Drug Use History of alcohol / drug use?: Yes Substance #1 Name of Substance 1: ETOH 1 - Age of First Use: teens 1 - Amount (size/oz): varies 1 - Frequency: daily 1 - Duration: years 1 - Last Use / Amount: 06/10/2011 amt unknown  Substance #2 Name of Substance 2: THC 2 - Age of First Use: unknown 2 - Amount (size/oz): unknown 2 - Frequency: occassionally 2 - Duration: unknown 2 - Last Use / Amount: unknown  CIWA: CIWA-Ar BP: 131/84 mmHg Pulse Rate: 97  COWS:    Allergies: No Known Allergies  Home Medications:  (Not in a hospital admission)  OB/GYN Status:  Patient's last menstrual period was 05/25/2011.  General Assessment Data Location of Assessment: [facility] ED Living Arrangements: Spouse/significant other Can pt return to current living arrangement?: Yes Admission Status: Involuntary Is patient capable of signing voluntary admission?: No Transfer from:  Home Referral Source: Self/Family/Friend     Risk to self Suicidal Ideation: Yes-Currently Present Suicidal Intent: Yes-Currently Present Is patient at risk for suicide?: Yes Suicidal Plan?: Yes-Currently Present Specify Current Suicidal Plan: Plan to OD on Xanax & Excessive use of ETOH Access to Means: Yes Specify Access to Suicidal Means: Access to Rx's What  has been your use of drugs/alcohol within the last 12 months?: THC & ETOH Previous Attempts/Gestures: Yes (Previous gestures 3x) How many times?: 3  Triggers for Past Attempts: Unpredictable Intentional Self Injurious Behavior: None Family Suicide History: Yes (Father attempted) Recent stressful life event(s): Conflict (Comment) (Relational stressors) Persecutory voices/beliefs?: No Depression: Yes Depression Symptoms: Feeling angry/irritable Substance abuse history and/or treatment for substance abuse?: Yes Suicide prevention information given to non-admitted patients: Not applicable  Risk to Others Homicidal Ideation: No-Not Currently/Within Last 6 Months Thoughts of Harm to Others: No-Not Currently Present/Within Last 6 Months Current Homicidal Intent: No-Not Currently/Within Last 6 Months Current Homicidal Plan: No-Not Currently/Within Last 6 Months Access to Homicidal Means: No Identified Victim: None Reported History of harm to others?: No (Pt denies) Assessment of Violence: None Noted Does patient have access to weapons?: No Criminal Charges Pending?:  (Pt would not respond) Does patient have a court date:  (Pt would not respond)  Psychosis Hallucinations: None noted Delusions: None noted  Mental Status Report Appear/Hygiene: Disheveled Eye Contact: Poor Motor Activity: Restlessness;Agitation Speech: Slow Level of Consciousness: Restless;Irritable Mood: Anxious;Suspicious;Angry;Irritable Affect: Anxious;Angry;Irritable Anxiety Level: None Thought Processes: Coherent Judgement: Impaired Orientation: Person;Place;Time;Situation Obsessive Compulsive Thoughts/Behaviors: None  Cognitive Functioning Concentration: Decreased Memory: Recent Intact IQ: Average Insight: Poor Impulse Control: Poor Appetite: Good Weight Loss: 0  Weight Gain: 0  Sleep: Decreased Total Hours of Sleep: 6  Vegetative Symptoms: None  ADLScreening [facility] Assessment Services) Patient's  cognitive ability adequate to safely complete daily activities?: Yes Patient able to express need for assistance with ADLs?: Yes Independently performs ADLs?: Yes  Abuse/Neglect [facility]) Physical Abuse:  (Pt refused to provide response) Verbal Abuse:  (pt refused to provide response) Sexual Abuse:  (pt refused to provide response)  Prior Inpatient Therapy Prior Inpatient Therapy: Yes Prior Therapy Dates:  (Pt could not provide date ) Prior Therapy Facilty/Provider(s): [facility] (Name unknown by pt or pt's mother) Reason for Treatment: [facility]  Prior Outpatient Therapy Prior Outpatient Therapy: Yes Prior Therapy Dates:  (13 years ago) Prior Therapy Facilty/Provider(s):  (Name unknown by pt) Reason for Treatment:  (Outpatient counseling due to parental divorce)  ADL Screening (condition at time of admission) Patient's cognitive ability adequate to safely complete daily activities?: Yes Patient able to express need for assistance with ADLs?: Yes Independently performs ADLs?: Yes Weakness of Legs: None Weakness of Arms/Hands: None  Home Assistive Devices/Equipment Home Assistive Devices/Equipment: None  Therapy Consults (therapy consults require a physician order) PT Evaluation Needed: No OT Evalulation Needed: No SLP Evaluation Needed: No Abuse/Neglect Assessment (Assessment to be complete while patient is alone) Physical Abuse:  (Pt refused to provide response) Verbal Abuse:  (pt refused to provide response) Sexual Abuse:  (pt refused to provide response) Exploitation of patient/patient's resources: Denies Self-Neglect: Denies Values / Beliefs Cultural Requests During Hospitalization: None Spiritual Requests During Hospitalization: None Consults Spiritual Care Consult Needed: No Social Work Consult Needed: No      Additional Information 1:1 In Past 12 Months?: No CIRT Risk: No Elopement Risk: Yes Does patient have medical clearance?: No     Disposition: Inpatient  treatment program Disposition Disposition of Patient: Inpatient treatment program  Type of inpatient treatment program: Adult  On Site Evaluation by:  Self Reviewed with Physician:     Paulino Door, Kinan Safley C 06/11/2011 6:01 PM

## 2011-06-11 NOTE — ED Notes (Addendum)
AC notified for sitter request. None available. Close monitoring provided by staff. Charge nurse aware.

## 2011-06-11 NOTE — ED Notes (Signed)
ZOX:WRUE<AV> Expected date:06/11/11<BR> Expected time:<BR> Means of arrival:<BR> Comments:<BR> EMS 40 GC - suicidal subject

## 2011-06-11 NOTE — ED Notes (Addendum)
On arrival, pt was sitting outside of ED and family ran inside to get help.  Pt reports falling down steps just pta and states she just wants to lay down.  Hematoma to back of head.  Pt placed in wheelchair and c/o nausea and dizziness with movement of wheelchair. Now pt seen walking out of ED waiting room.  Family reports that pt is leaving and states that pt is suicidal.  Suicidal ideation/attempt was not mentioned earlier.  Nurse 1st outside to speak with pt and she denies suicidal ideation and states she wants to leave.  States she is going to smoke a cigarette.  Family continues to report that pt is suicidal and states that pt was seen in ED at [facility] for same last night.  Encouraged pt to come back in to let EDP examine her head.  Pt's dad tells her that she is not leaving.  Pt brought back to triage and Dr. Patria Mane notified of pt and in to assess her.  Dr. Patria Mane saw pt last night at [facility] ED.  Pt's dad states he is going to [employer] office for IVC paperwork.  Another visitor with pt is now reporting pt took a full bottle of xanax today.  Dr. Patria Mane notified of same.  Pt denies.

## 2011-06-11 NOTE — ED Notes (Signed)
Pt back from CT. Sitter name Gracelyn Nurse now arrived, and at bedside, pt ambulatory and remains alert, mom at bedside, BF not allow.

## 2011-06-11 NOTE — ED Provider Notes (Signed)
History     CSN: [id number]  Arrival date & time 06/10/11  2317   First MD Initiated Contact with Patient 06/10/11 2355      Chief Complaint  Patient presents with  . Fall     The history is provided by the patient.   the patient reports she was walking down the stairs this evening when her dog hit her leg and she fell and hit the back of her head.  She reports pain in her posterior scalp.  She has no laceration.  She denies nausea or vomiting.  She denies weakness of her upper lower extremities.  She denies neck pain.  She does admit to alcohol use this evening.  She has no other complaints.  Past Medical History  Diagnosis Date  . ANXIETY 05/20/2009  . DEPRESSION/ANXIETY 03/24/2008  . DEPRESSION 05/20/2009  . INSOMNIA-SLEEP DISORDER-UNSPEC 03/24/2008  . PSORIASIS 03/24/2008  . SKIN RASH 08/13/2009  . Seizure     Past Surgical History  Procedure Date  . S/p uterine polyp 2010  . Ovarian cyst removal     Family History  Problem Relation Age of Onset  . Diabetes Mother   . Anxiety disorder Mother   . Depression Father   . Diabetes Other     History  Substance Use Topics  . Smoking status: Current Everyday Smoker -- 0.5 packs/day  . Smokeless tobacco: Not on file  . Alcohol Use: 1.8 oz/week    2 Cans of beer, 1 Shots of liquor per week     daily drinker    OB History    Grav Para Term Preterm Abortions TAB SAB Ect Mult Living                  Review of Systems  All other systems reviewed and are negative.    Allergies  Review of patient's allergies indicates no known allergies.  Home Medications   Current Outpatient Rx  Name Route Sig Dispense Refill  . ADALIMUMAB 40 MG/0.8ML [place] KIT Subcutaneous Inject 0.8 mLs (40 mg total) into the skin every 14 (fourteen) days. 1 each 2  . ALPRAZOLAM 0.5 MG PO TABS Oral Take 1 tablet (0.5 mg total) by mouth 2 (two) times daily as needed for anxiety. 60 tablet 1    Please make return office visit for further refill ...    . DICYCLOMINE HCL 20 MG PO TABS Oral Take 1 tablet (20 mg total) by mouth 2 (two) times daily. 20 tablet 0  . TETRAHYDROZOLINE HCL 0.05 % OP SOLN Both Eyes Place 1 drop into both eyes daily. As needed for pain/dryness.      BP 132/92  Pulse 118  Temp(Src) 98.2 F (36.8 C) (Oral)  Resp 18  SpO2 98%  LMP 05/25/2011  Physical Exam  Nursing note and vitals reviewed. Constitutional: She is oriented to person, place, and time. She appears well-developed and well-nourished. No distress.  HENT:  Head: Normocephalic.       Hematoma of her posterior scalp.  No laceration.  Small abrasion.  Eyes: EOM are normal.  Neck: Normal range of motion. Neck supple.       No C-spine tenderness or step-offs.  Cardiovascular: Normal rate, regular rhythm and normal heart sounds.   Pulmonary/Chest: Effort normal and breath sounds normal.  Abdominal: Soft. She exhibits no distension. There is no tenderness.  Musculoskeletal: Normal range of motion.  Neurological: She is alert and oriented to person, place, and time.  5 out of 5 strength in bilateral upper lower extremity major muscle groups  Skin: Skin is warm and dry.  Psychiatric: She has a normal mood and affect. Judgment normal.    ED Course  Procedures (including critical care time)  Labs Reviewed - No data to display No results found.   1. Minor head injury       MDM  Minor closed head injury.  No indication for imaging of her head.  No vomiting.  She understands return the emergency department for new or worsening symptoms.  She does report EtOH use today but she reports she slipped secondary to her hitting her leg.  She does not want assistance with alcohol abuse        Lyanne Co, MD 06/11/11 [phone]
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
## 2011-06-11 NOTE — ED Notes (Signed)
EMS report pt claims: "I don't want to be here anymore.  I don't want to live anymore. I just want to go away"
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
## 2011-06-11 NOTE — ED Notes (Signed)
Pt is restraint at the moment, order per Dr. Freida Busman, 4 point restraint applied. NO visitor at the moment.

## 2011-06-11 NOTE — ED Notes (Signed)
Removed restraints to ambulate to bathroom.  No sitter available at this time.  AC and Charge RN aware.

## 2011-06-11 NOTE — ED Notes (Signed)
Social worker greg at bedside
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
## 2011-06-11 NOTE — ED Notes (Signed)
Pt to CT

## 2011-06-12 NOTE — ED Provider Notes (Signed)
Psychiatric rounding reveals patient resting comfortably.  Vital signs stable.  Will consider telemetry psych.  Nelia Shi, MD 06/12/11 [phone]

## 2011-06-12 NOTE — Discharge Instructions (Signed)
Anxiety and Panic Attacks Anxiety is your body's way of reacting to real danger or something you think is a danger. It may be fear or worry over a situation like losing your job. Sometimes the cause is not known. A panic attack is made up of physical signs like sweating, shaking, or chest pain. Anxiety and panic attacks may start suddenly. They may be strong. They may come at any time of day, even while sleeping. They may come at any time of life. Panic attacks are scary, but they do not harm you physically.  HOME CARE  Avoid any known causes of your anxiety.   Try to relax. Yoga may help. Tell yourself everything will be okay.   Exercise often.   Get expert advice and help (therapy) to stop anxiety or attacks from happening.   Avoid caffeine, alcohol, and drugs.   Only take medicine as told by your doctor.  GET HELP RIGHT AWAY IF:  Your attacks seem different than normal attacks.   Your problems are getting worse or concern you.  MAKE SURE YOU:  Understand these instructions.   Will watch your condition.   Will get help right away if you are not doing well or get worse.  Document Released: 01/29/2010 Document Revised: 12/16/2010 Document Reviewed: 01/29/2010 [employer] Patient Information 2012 [employer]. 

## 2011-06-12 NOTE — ED Notes (Signed)
Pt discharged home to family, her mom and stepdad. Pt was given a work excuse by Dr Radford Pax. It's assumed that pt's belongings (a bikini) was sent home with her family because it wasn't in her locker #35.

## 2011-06-12 NOTE — ED Notes (Signed)
Telepsych in progress. 

## 2011-06-12 NOTE — ED Notes (Signed)
Pt up to bathroom.

## 2011-06-12 NOTE — ED Notes (Signed)
Patient evaluated by psychiatrist who felt that she was no longer suicidal and she could be discharged from the emergency department with psychiatric followup.  She was referred to behavioral health and instructed to return to emergency department prior to that should she have any need.  Nelia Shi, MD 06/12/11 1315

## 2011-06-28 ENCOUNTER — Encounter (HOSPITAL_COMMUNITY): Payer: Self-pay | Admitting: Emergency Medicine

## 2011-06-28 ENCOUNTER — Emergency Department (HOSPITAL_COMMUNITY)
Admission: EM | Admit: 2011-06-28 | Discharge: 2011-06-28 | Disposition: A | Discharge status: Home / Self Care | Payer: BC Managed Care – PPO | Attending: Emergency Medicine | Admitting: Emergency Medicine

## 2011-06-28 DIAGNOSIS — L408 Other psoriasis: Secondary | ICD-10-CM | POA: Insufficient documentation

## 2011-06-28 DIAGNOSIS — Z833 Family history of diabetes mellitus: Secondary | ICD-10-CM | POA: Insufficient documentation

## 2011-06-28 DIAGNOSIS — R569 Unspecified convulsions: Secondary | ICD-10-CM | POA: Insufficient documentation

## 2011-06-28 DIAGNOSIS — Z818 Family history of other mental and behavioral disorders: Secondary | ICD-10-CM | POA: Insufficient documentation

## 2011-06-28 DIAGNOSIS — F17200 Nicotine dependence, unspecified, uncomplicated: Secondary | ICD-10-CM | POA: Insufficient documentation

## 2011-06-28 DIAGNOSIS — R21 Rash and other nonspecific skin eruption: Secondary | ICD-10-CM | POA: Insufficient documentation

## 2011-06-28 DIAGNOSIS — F3289 Other specified depressive episodes: Secondary | ICD-10-CM | POA: Insufficient documentation

## 2011-06-28 DIAGNOSIS — F329 Major depressive disorder, single episode, unspecified: Secondary | ICD-10-CM | POA: Insufficient documentation

## 2011-06-28 DIAGNOSIS — K5900 Constipation, unspecified: Secondary | ICD-10-CM | POA: Insufficient documentation

## 2011-06-28 DIAGNOSIS — G4700 Insomnia, unspecified: Secondary | ICD-10-CM | POA: Insufficient documentation

## 2011-06-28 DIAGNOSIS — Z9889 Other specified postprocedural states: Secondary | ICD-10-CM | POA: Insufficient documentation

## 2011-06-28 DIAGNOSIS — F411 Generalized anxiety disorder: Secondary | ICD-10-CM | POA: Insufficient documentation

## 2011-06-28 MED ORDER — ONDANSETRON 4 MG PO TBDP
4.0000 mg | ORAL_TABLET | Freq: Once | ORAL | Status: AC
  Administered 2011-06-28: 4 mg via ORAL

## 2011-06-28 MED ORDER — ONDANSETRON 4 MG PO TBDP
ORAL_TABLET | ORAL | Status: AC
  Administered 2011-06-28: 4 mg via ORAL
  Filled 2011-06-28: qty 1

## 2011-06-28 MED ORDER — MINERAL OIL RE ENEM: 1.0000 | ENEMA | Freq: Once | RECTAL | Status: AC

## 2011-06-28 NOTE — Discharge Instructions (Signed)
Eat dried fruit and increase the fiber in your diet with vegetables.   Use fleets enemas if this is not effective.  Follow up with a gastroenterologist if none of these recommendations work. Return for worse symptoms.

## 2011-06-28 NOTE — ED Notes (Signed)
Pt ambulated to restroom. Reports passed large amount of stool, but reports blood in stool. Continues to report nausea. MD Caporossi made aware- orders received for Zofran ODT.

## 2011-06-28 NOTE — ED Provider Notes (Signed)
History     CSN: [id number]  Arrival date & time 06/28/11  0707   First MD Initiated Contact with Patient 06/28/11 [phone]      Chief Complaint  Patient presents with  . Fecal Impaction    (Consider location/radiation/quality/duration/timing/severity/associated sxs/prior treatment) The history is provided by the patient.  the pt c/o painful constipation.  Has not had a bm in several days.  Denies taking any meds.  eg no narcotics.  Also has nausea. No vomiting. No hx of abd surgery.   No dietary restrictions.  Past Medical History  Diagnosis Date  . ANXIETY 05/20/2009  . DEPRESSION/ANXIETY 03/24/2008  . DEPRESSION 05/20/2009  . INSOMNIA-SLEEP DISORDER-UNSPEC 03/24/2008  . PSORIASIS 03/24/2008  . SKIN RASH 08/13/2009  . Seizure     Past Surgical History  Procedure Date  . S/p uterine polyp 2010  . Ovarian cyst removal     Family History  Problem Relation Age of Onset  . Diabetes Mother   . Anxiety disorder Mother   . Depression Father   . Diabetes Other     History  Substance Use Topics  . Smoking status: Current Everyday Smoker -- 1.0 packs/day    Types: Cigarettes  . Smokeless tobacco: Not on file  . Alcohol Use: 1.8 oz/week    2 Cans of beer, 1 Shots of liquor per week     daily drinker    OB History    Grav Para Term Preterm Abortions TAB SAB Ect Mult Living                  Review of Systems  Gastrointestinal: Positive for nausea, abdominal pain, constipation and abdominal distention. Negative for vomiting and diarrhea.  All other systems reviewed and are negative.    Allergies  Review of patient's allergies indicates no known allergies.  Home Medications   Current Outpatient Rx  Name Route Sig Dispense Refill  . ADALIMUMAB 40 MG/0.8ML [place] KIT Subcutaneous Inject 40 mg into the skin once.    . ADALIMUMAB 40 MG/0.8ML [place] KIT Subcutaneous Inject 0.8 mLs (40 mg total) into the skin every 14 (fourteen) days. 1 each 2    BP 134/93  Pulse 96  Temp 97.9  F (36.6 C) (Oral)  Resp 24  SpO2 98%  LMP 05/25/2011  Physical Exam  Nursing note and vitals reviewed. Constitutional: She is oriented to person, place, and time. She appears well-developed and well-nourished. No distress.       Pt examined AFTER she had two large bowel movements in ed.  HENT:  Head: Normocephalic and atraumatic.  Eyes: Conjunctivae are normal.  Neck: Normal range of motion.  Pulmonary/Chest: Effort normal.  Abdominal: Soft. She exhibits no distension. There is no tenderness. There is no rebound and no guarding.  Musculoskeletal: Normal range of motion.  Neurological: She is alert and oriented to person, place, and time.  Skin: Skin is warm and dry.  Psychiatric: She has a normal mood and affect. Thought content normal.    ED Course  Procedures (including critical care time) Pt had two bms in ed. sxs resolved. No tests or intervention indicated.   Labs Reviewed  PREGNANCY, URINE  URINALYSIS, ROUTINE W REFLEX MICROSCOPIC   No results found.   No diagnosis found.    MDM  Constipation. Resolved in ed.        Cheri Guppy, MD 06/28/11 [phone]

## 2011-06-28 NOTE — ED Notes (Addendum)
Pt reports it has been a week since any bowel movement. PT threw up tonight. Took ex lax but did not help. Highly anxious and crying.

## 2011-07-13 ENCOUNTER — Ambulatory Visit (HOSPITAL_COMMUNITY): Payer: BC Managed Care – PPO | Admitting: Psychiatry

## 2011-10-19 ENCOUNTER — Other Ambulatory Visit: Payer: Self-pay | Admitting: Internal Medicine

## 2011-10-19 NOTE — Telephone Encounter (Signed)
Done hardcopy to [facility]  

## 2011-10-19 NOTE — Telephone Encounter (Signed)
Faxed hardcopy to pharmacy. 

## 2011-10-27 ENCOUNTER — Other Ambulatory Visit: Payer: Self-pay | Admitting: Internal Medicine
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
## 2011-10-27 NOTE — Telephone Encounter (Signed)
Unable to refill  Last seen June 2012  Needs OV

## 2011-10-27 NOTE — Telephone Encounter (Signed)
Called the patient to inform of Md instructions. Number in chart does not work

## 2011-11-08 ENCOUNTER — Other Ambulatory Visit: Payer: Self-pay | Admitting: Internal Medicine

## 2012-02-28 ENCOUNTER — Other Ambulatory Visit: Payer: Self-pay | Admitting: Internal Medicine

## 2012-03-14 ENCOUNTER — Other Ambulatory Visit: Payer: Self-pay | Admitting: Internal Medicine

## 2013-09-29 ENCOUNTER — Encounter (HOSPITAL_COMMUNITY): Payer: Self-pay | Admitting: Emergency Medicine

## 2013-09-29 ENCOUNTER — Emergency Department (HOSPITAL_COMMUNITY)
Admission: EM | Admit: 2013-09-29 | Discharge: 2013-09-30 | Disposition: A | Discharge status: Home / Self Care | Payer: Self-pay | Attending: Emergency Medicine | Admitting: Emergency Medicine

## 2013-09-29 NOTE — ED Notes (Signed)
Pt brought to the ER under IVC for stating that she wished she would never wake up; pt denies SI currently; pt reports that she has went through a break up in the last 3 weeks and has been drinking heavily; pt reports that she normally is a social drinker but it has increased over the last 3 weeks since the break up; pt states that she made the statement that she wished she would never wake up but that her roommate and family have taken it out of context; pt denies SI / HI

## 2013-09-30 ENCOUNTER — Encounter (HOSPITAL_COMMUNITY): Payer: Self-pay | Admitting: Emergency Medicine

## 2013-09-30 ENCOUNTER — Encounter (HOSPITAL_COMMUNITY): Payer: Self-pay | Admitting: General Practice

## 2013-09-30 ENCOUNTER — Inpatient Hospital Stay (HOSPITAL_COMMUNITY)
Admission: AD | Admit: 2013-09-30 | Discharge: 2013-10-03 | DRG: 897 | Disposition: A | Discharge status: Home / Self Care | Payer: Federal, State, Local not specified - Other | Source: Intra-hospital | Attending: Psychiatry | Admitting: Psychiatry

## 2013-09-30 ENCOUNTER — Emergency Department (HOSPITAL_COMMUNITY)
Admission: EM | Admit: 2013-09-30 | Discharge: 2013-09-30 | Disposition: A | Discharge status: Other Acute Inpatient Hospital | Payer: Federal, State, Local not specified - Other | Attending: Emergency Medicine | Admitting: Emergency Medicine

## 2013-09-30 DIAGNOSIS — F1020 Alcohol dependence, uncomplicated: Principal | ICD-10-CM | POA: Diagnosis present

## 2013-09-30 DIAGNOSIS — F411 Generalized anxiety disorder: Secondary | ICD-10-CM | POA: Diagnosis present

## 2013-09-30 DIAGNOSIS — F10230 Alcohol dependence with withdrawal, uncomplicated: Secondary | ICD-10-CM

## 2013-09-30 DIAGNOSIS — F17200 Nicotine dependence, unspecified, uncomplicated: Secondary | ICD-10-CM | POA: Diagnosis present

## 2013-09-30 DIAGNOSIS — F329 Major depressive disorder, single episode, unspecified: Secondary | ICD-10-CM | POA: Diagnosis present

## 2013-09-30 DIAGNOSIS — R5383 Other fatigue: Secondary | ICD-10-CM

## 2013-09-30 DIAGNOSIS — Z3202 Encounter for pregnancy test, result negative: Secondary | ICD-10-CM | POA: Insufficient documentation

## 2013-09-30 DIAGNOSIS — G4700 Insomnia, unspecified: Secondary | ICD-10-CM | POA: Diagnosis present

## 2013-09-30 DIAGNOSIS — Z789 Other specified health status: Secondary | ICD-10-CM

## 2013-09-30 DIAGNOSIS — R45851 Suicidal ideations: Secondary | ICD-10-CM

## 2013-09-30 DIAGNOSIS — F39 Unspecified mood [affective] disorder: Secondary | ICD-10-CM

## 2013-09-30 DIAGNOSIS — F1024 Alcohol dependence with alcohol-induced mood disorder: Secondary | ICD-10-CM

## 2013-09-30 DIAGNOSIS — F3289 Other specified depressive episodes: Secondary | ICD-10-CM | POA: Insufficient documentation

## 2013-09-30 DIAGNOSIS — F1510 Other stimulant abuse, uncomplicated: Secondary | ICD-10-CM | POA: Insufficient documentation

## 2013-09-30 DIAGNOSIS — R5381 Other malaise: Secondary | ICD-10-CM | POA: Diagnosis present

## 2013-09-30 DIAGNOSIS — Z872 Personal history of diseases of the skin and subcutaneous tissue: Secondary | ICD-10-CM | POA: Insufficient documentation

## 2013-09-30 DIAGNOSIS — Z8669 Personal history of other diseases of the nervous system and sense organs: Secondary | ICD-10-CM | POA: Insufficient documentation

## 2013-09-30 DIAGNOSIS — F1090 Alcohol use, unspecified, uncomplicated: Secondary | ICD-10-CM

## 2013-09-30 DIAGNOSIS — F1010 Alcohol abuse, uncomplicated: Secondary | ICD-10-CM | POA: Insufficient documentation

## 2013-09-30 DIAGNOSIS — F1210 Cannabis abuse, uncomplicated: Secondary | ICD-10-CM | POA: Insufficient documentation

## 2013-09-30 DIAGNOSIS — Z7289 Other problems related to lifestyle: Secondary | ICD-10-CM

## 2013-09-30 LAB — COMPREHENSIVE METABOLIC PANEL
ALBUMIN: 4.2 g/dL (ref 3.5–5.2)
ALK PHOS: 89 U/L (ref 39–117)
ALT: 40 U/L — ABNORMAL HIGH (ref 0–35)
ALT: 40 U/L — ABNORMAL HIGH (ref 0–35)
AST: 43 U/L — ABNORMAL HIGH (ref 0–37)
AST: 43 U/L — ABNORMAL HIGH (ref 0–37)
Albumin: 3.9 g/dL (ref 3.5–5.2)
Alkaline Phosphatase: 87 U/L (ref 39–117)
Anion gap: 13 (ref 5–15)
Anion gap: 20 — ABNORMAL HIGH (ref 5–15)
BILIRUBIN TOTAL: 0.6 mg/dL (ref 0.3–1.2)
BUN: 8 mg/dL (ref 6–23)
BUN: 9 mg/dL (ref 6–23)
CALCIUM: 9.2 mg/dL (ref 8.4–10.5)
CHLORIDE: 100 meq/L (ref 96–112)
CHLORIDE: 103 meq/L (ref 96–112)
CO2: 21 meq/L (ref 19–32)
CO2: 26 mEq/L (ref 19–32)
CREATININE: 0.58 mg/dL (ref 0.50–1.10)
Calcium: 8.9 mg/dL (ref 8.4–10.5)
Creatinine, Ser: 0.58 mg/dL (ref 0.50–1.10)
GFR calc Af Amer: >90 mL/min (ref >90)
GFR calc non Af Amer: >90 mL/min (ref >90)
GFR calc non Af Amer: >90 mL/min (ref >90)
GLUCOSE: 98 mg/dL (ref 70–99)
Glucose, Bld: 106 mg/dL — ABNORMAL HIGH (ref 70–99)
POTASSIUM: 4.1 meq/L (ref 3.7–5.3)
Potassium: 4.4 mEq/L (ref 3.7–5.3)
Sodium: 139 mEq/L (ref 137–147)
Sodium: 144 mEq/L (ref 137–147)
Total Bilirubin: <0.2 mg/dL — ABNORMAL LOW (ref 0.3–1.2)
Total Protein: 7.5 g/dL (ref 6.0–8.3)
Total Protein: 7.9 g/dL (ref 6.0–8.3)

## 2013-09-30 LAB — ACETAMINOPHEN LEVEL
Acetaminophen (Tylenol), Serum: <15 ug/mL (ref 10–30)
Acetaminophen (Tylenol), Serum: <15 ug/mL (ref 10–30)

## 2013-09-30 LAB — CBC
HCT: 43.4 % (ref 36.0–46.0)
HEMATOCRIT: 46.4 % — AB (ref 36.0–46.0)
Hemoglobin: 14.8 g/dL (ref 12.0–15.0)
Hemoglobin: 16 g/dL — ABNORMAL HIGH (ref 12.0–15.0)
MCH: 31.9 pg (ref 26.0–34.0)
MCH: 32.8 pg (ref 26.0–34.0)
MCHC: 34.1 g/dL (ref 30.0–36.0)
MCHC: 34.5 g/dL (ref 30.0–36.0)
MCV: 93.5 fL (ref 78.0–100.0)
MCV: 95.1 fL (ref 78.0–100.0)
PLATELETS: 275 10*3/uL (ref 150–400)
Platelets: 281 10*3/uL (ref 150–400)
RBC: 4.64 MIL/uL (ref 3.87–5.11)
RBC: 4.88 MIL/uL (ref 3.87–5.11)
RDW: 13.3 % (ref 11.5–15.5)
RDW: 13.5 % (ref 11.5–15.5)
WBC: 10.8 10*3/uL — ABNORMAL HIGH (ref 4.0–10.5)
WBC: 9.3 10*3/uL (ref 4.0–10.5)

## 2013-09-30 LAB — RAPID URINE DRUG SCREEN, HOSP PERFORMED
Amphetamines: POSITIVE — AB
BARBITURATES: NOT DETECTED
Benzodiazepines: NOT DETECTED
COCAINE: NOT DETECTED
OPIATES: NOT DETECTED
Tetrahydrocannabinol: POSITIVE — AB

## 2013-09-30 LAB — ETHANOL
Alcohol, Ethyl (B): 268 mg/dL — ABNORMAL HIGH (ref 0–11)
Alcohol, Ethyl (B): <11 mg/dL (ref 0–11)

## 2013-09-30 LAB — SALICYLATE LEVEL
Salicylate Lvl: <2 mg/dL — ABNORMAL LOW (ref 2.8–20.0)
Salicylate Lvl: <2 mg/dL — ABNORMAL LOW (ref 2.8–20.0)

## 2013-09-30 LAB — POC URINE PREG, ED: Preg Test, Ur: NEGATIVE

## 2013-09-30 MED ORDER — LORAZEPAM 1 MG PO TABS: 0.0000 mg | ORAL_TABLET | Freq: Two times a day (BID) | ORAL | Status: DC

## 2013-09-30 MED ORDER — ZOLPIDEM TARTRATE 5 MG PO TABS: 5.0000 mg | ORAL_TABLET | Freq: Every evening | ORAL | Status: DC | PRN

## 2013-09-30 MED ORDER — QUETIAPINE FUMARATE 50 MG PO TABS: 50.0000 mg | ORAL_TABLET | Freq: Every day | ORAL | Status: DC

## 2013-09-30 MED ORDER — VITAMIN B-1 100 MG PO TABS
100.0000 mg | ORAL_TABLET | Freq: Every day | ORAL | Status: DC
Start: 2013-09-30 — End: 2013-09-30
  Administered 2013-09-30: 100 mg via ORAL
  Filled 2013-09-30: qty 1

## 2013-09-30 MED ORDER — ALUM & MAG HYDROXIDE-SIMETH 200-200-20 MG/5ML PO SUSP
30.0000 mL | ORAL | Status: DC | PRN
Start: 2013-09-30 — End: 2013-09-30

## 2013-09-30 MED ORDER — NICOTINE 21 MG/24HR TD PT24
21.0000 mg | MEDICATED_PATCH | Freq: Every day | TRANSDERMAL | Status: DC
  Administered 2013-09-30 – 2013-10-03 (×5): 21 mg via TRANSDERMAL
  Filled 2013-09-30 (×7): qty 1

## 2013-09-30 MED ORDER — FLUOXETINE HCL 20 MG PO CAPS
20.0000 mg | ORAL_CAPSULE | Freq: Every day | ORAL | Status: DC
  Filled 2013-09-30: qty 1

## 2013-09-30 MED ORDER — FLUOXETINE HCL 20 MG PO CAPS
20.0000 mg | ORAL_CAPSULE | Freq: Every day | ORAL | Status: DC
  Administered 2013-09-30 – 2013-10-03 (×4): 20 mg via ORAL
  Filled 2013-09-30: qty 14
  Filled 2013-09-30 (×6): qty 1

## 2013-09-30 MED ORDER — ACETAMINOPHEN 325 MG PO TABS: 650.0000 mg | ORAL_TABLET | ORAL | Status: DC | PRN

## 2013-09-30 MED ORDER — ONDANSETRON HCL 4 MG PO TABS
4.0000 mg | ORAL_TABLET | Freq: Three times a day (TID) | ORAL | Status: DC | PRN
Start: 2013-09-30 — End: 2013-09-30
  Administered 2013-09-30: 4 mg via ORAL
  Filled 2013-09-30: qty 1

## 2013-09-30 MED ORDER — ALUM & MAG HYDROXIDE-SIMETH 200-200-20 MG/5ML PO SUSP: 30.0000 mL | ORAL | Status: DC | PRN

## 2013-09-30 MED ORDER — MAGNESIUM HYDROXIDE 400 MG/5ML PO SUSP: 30.0000 mL | Freq: Every day | ORAL | Status: DC | PRN

## 2013-09-30 MED ORDER — IBUPROFEN 200 MG PO TABS: 600.0000 mg | ORAL_TABLET | Freq: Three times a day (TID) | ORAL | Status: DC | PRN

## 2013-09-30 MED ORDER — THIAMINE HCL 100 MG/ML IJ SOLN: 100.0000 mg | Freq: Every day | INTRAMUSCULAR | Status: DC

## 2013-09-30 MED ORDER — HYDROXYZINE HCL 25 MG PO TABS
25.0000 mg | ORAL_TABLET | Freq: Four times a day (QID) | ORAL | Status: DC | PRN
  Administered 2013-09-30 – 2013-10-03 (×7): 25 mg via ORAL
  Filled 2013-09-30 (×6): qty 1

## 2013-09-30 MED ORDER — QUETIAPINE FUMARATE 50 MG PO TABS
50.0000 mg | ORAL_TABLET | Freq: Every day | ORAL | Status: DC
  Administered 2013-09-30 – 2013-10-01 (×2): 50 mg via ORAL
  Filled 2013-09-30 (×3): qty 1

## 2013-09-30 MED ORDER — LORAZEPAM 1 MG PO TABS
0.0000 mg | ORAL_TABLET | Freq: Four times a day (QID) | ORAL | Status: DC
  Administered 2013-09-30: 1 mg via ORAL
  Filled 2013-09-30: qty 1

## 2013-09-30 MED ORDER — ACETAMINOPHEN 325 MG PO TABS
650.0000 mg | ORAL_TABLET | Freq: Four times a day (QID) | ORAL | Status: DC | PRN
  Administered 2013-10-03: 650 mg via ORAL
  Filled 2013-09-30: qty 2

## 2013-09-30 MED ORDER — NICOTINE 21 MG/24HR TD PT24
21.0000 mg | MEDICATED_PATCH | Freq: Every day | TRANSDERMAL | Status: DC
  Filled 2013-09-30: qty 1

## 2013-09-30 NOTE — ED Notes (Signed)
Patient was picked up by [employer]. Report was called already to [place] at [facility].

## 2013-09-30 NOTE — BH Assessment (Signed)
Unable to complete assessment at this time. Pt is asleep and not responding to her name being called or taps on the shoulder. Will complete assessment when pt is awake.

## 2013-09-30 NOTE — ED Provider Notes (Signed)
CSN: [id number]     Arrival date & time 09/30/13  0005 History   First MD Initiated Contact with Patient 09/30/13 0029     Chief Complaint  Patient presents with  . IVC      (Consider location/radiation/quality/duration/timing/severity/associated sxs/prior Treatment) HPI Comments: Katelyn Bridges is a 33 y.o. female with a PMHx of anxiety, depression, and ovarian cysts, who presents to the ED via [employer] with IVC papers that state that she told her roommate "I just don't want to wake up". Patient endorses feeling very depressed, and admits to stating that earlier tonight. She states after a break of 3 weeks ago and recently moved to this area, she is very sad and wanting to sleep a lot. She states that tonight she consumed 1.5 L of wine and became very sad regarding her current situation at this time. She has a history of depression for the last 20 years, she took Paxil as a 33 year old and she has not been taking any is now. She denies any drug use. Smokes one pack per day. She denies any active suicidal thoughts at this time, but does state that she thinks about it quite often and is still thinking about it, does not have any plans in mind. Denies any homicidal ideations or auditory/visual hallucinations. Denies any paranoid thought. States that she does think that she needs to stay for evaluation, because she "needs help". She denies any medical complaints at this time. Last menstrual period was 3 days ago, but states that it was abnormal and light.   Patient is a 33 y.o. female presenting with mental health disorder. The history is provided by the patient. No language interpreter was used.  Mental Health Problem Presenting symptoms: depression, suicidal thoughts and suicidal threats (stated "I wish I didn't wake up")   Presenting symptoms: no hallucinations, no homicidal ideas, no paranoid behavior, no self mutilation and no suicide attempt   Patient accompanied by:  Law enforcement Degree  of incapacity (severity):  Mild (intoxicated) Onset quality:  Gradual Duration:  3 weeks Timing:  Constant Progression:  Unchanged Chronicity:  Recurrent Context: alcohol use and stressful life event (recent break up with significant other)   Treatment compliance:  Untreated Relieved by:  None tried Worsened by:  Alcohol Ineffective treatments:  None tried Associated symptoms: anxiety, fatigue, feelings of worthlessness and hypersomnia   Associated symptoms: no abdominal pain, no chest pain, no decreased need for sleep and no insomnia   Risk factors: hx of mental illness   Risk factors: no hx of suicide attempts and no recent psychiatric admission     Past Medical History  Diagnosis Date  . ANXIETY 05/20/2009  . DEPRESSION/ANXIETY 03/24/2008  . DEPRESSION 05/20/2009  . INSOMNIA-SLEEP DISORDER-UNSPEC 03/24/2008  . PSORIASIS 03/24/2008  . SKIN RASH 08/13/2009  . Seizure    Past Surgical History  Procedure Laterality Date  . S/p uterine polyp  2010  . Ovarian cyst removal     Family History  Problem Relation Age of Onset  . Diabetes Mother   . Anxiety disorder Mother   . Depression Father   . Diabetes Other    History  Substance Use Topics  . Smoking status: Current Every Day Smoker -- 1.00 packs/day    Types: Cigarettes  . Smokeless tobacco: Not on file  . Alcohol Use: 1.8 oz/week    2 Cans of beer, 1 Shots of liquor per week     Comment: daily drinker   OB History   Grav  Para Term Preterm Abortions TAB SAB Ect Mult Living                 Review of Systems  Constitutional: Positive for fatigue.  Respiratory: Negative for shortness of breath.   Cardiovascular: Negative for chest pain.  Gastrointestinal: Negative for nausea, vomiting, abdominal pain, diarrhea and constipation.  Genitourinary: Negative for dysuria and hematuria.  Skin: Negative for rash.  Psychiatric/Behavioral: Positive for suicidal ideas. Negative for homicidal ideas, hallucinations, self-injury and  paranoia. The patient is nervous/anxious. The patient does not have insomnia.   10 Systems reviewed and are negative for acute change except as noted in the HPI.     Allergies  Review of patient's allergies indicates no known allergies.  Home Medications   Prior to Admission medications   Not on File   BP 126/80  Pulse 81  Temp(Src) 98.6 F (37 C) (Oral)  Resp 18  SpO2 98%  LMP 09/24/2013 Physical Exam  Nursing note and vitals reviewed. Constitutional: She is oriented to person, place, and time. Vital signs are normal. She appears well-developed and well-nourished. No distress.  Pleasant and cooperative  HENT:  Head: Normocephalic and atraumatic.  Mouth/Throat: Oropharynx is clear and moist and mucous membranes are normal.  Eyes: Conjunctivae and EOM are normal. Pupils are equal, round, and reactive to light. Right eye exhibits no discharge. Left eye exhibits no discharge.  Neck: Normal range of motion. Neck supple.  Cardiovascular: Normal rate, regular rhythm, normal heart sounds and intact distal pulses.  Exam reveals no gallop and no friction rub.   No murmur heard. Pulmonary/Chest: Effort normal and breath sounds normal. No respiratory distress. She has no decreased breath sounds. She has no wheezes. She has no rhonchi. She has no rales.  Abdominal: Soft. Normal appearance and bowel sounds are normal. She exhibits no distension. There is no tenderness. There is no rigidity, no rebound, no guarding, no CVA tenderness, no tenderness at McBurney's point and negative Murphy's sign.  Musculoskeletal: Normal range of motion.  Neurological: She is alert and oriented to person, place, and time. She has normal strength. No sensory deficit.  Skin: Skin is warm, dry and intact. No rash noted.  Psychiatric: Her speech is slurred (slight). She is not actively hallucinating. Thought content is not paranoid and not delusional. She exhibits a depressed mood. She expresses suicidal ideation.  She expresses no homicidal ideation. She expresses no suicidal plans and no homicidal plans.  Pleasant and cooperative, appears slightly depressed, mildly slurred words intermittently, but holding a conversation well. Endorsing some suicidal thoughts but no active plan at this time.    ED Course  Procedures (including critical care time) Labs Review Labs Reviewed  URINE RAPID DRUG SCREEN (HOSP PERFORMED)  POC URINE PREG, ED   Labs Review (listed under other account due to error in registration today) Labs Reviewed  CBC - Abnormal; Notable for the following:    Hemoglobin 16.0 (*)    HCT 46.4 (*)    All other components within normal limits  COMPREHENSIVE METABOLIC PANEL - Abnormal; Notable for the following:    Glucose, Bld 106 (*)    AST 43 (*)    ALT 40 (*)    Total Bilirubin <0.2 (*)    Anion gap 20 (*)    All other components within normal limits  ETHANOL - Abnormal; Notable for the following:    Alcohol, Ethyl (B) 268 (*)    All other components within normal limits  SALICYLATE LEVEL -  Abnormal; Notable for the following:    Salicylate Lvl <2.0 (*)    All other components within normal limits  ACETAMINOPHEN LEVEL   Imaging Review No results found.   EKG Interpretation None      MDM   Final diagnoses:  Depressed state  Suicidal thoughts  Anxiety state  Alcohol use   33y/o female here d/t IVC paperwork stating she was suicidal. Plan to medically clear and then have TTS Consult, agreed that she psychiatric evaluation.  2:07 AM Labs showing EtOH 268, CMP with mildly elevated transaminases but c/w EtOH use, Upreg neg. UDS pending, but feel pt is medically cleared and will consult TTS now. CIWA in place.  BP 126/80  Pulse 81  Temp(Src) 98.6 F (37 C) (Oral)  Resp 18  SpO2 98%  LMP 09/24/2013  Meds ordered this encounter  Medications  . LORazepam (ATIVAN) tablet 0-4 mg    Sig:     Order Specific Question:  CIWA-AR < 5 =    Answer:  0    Order Specific  Question:  CIWA-AR 5 -10 =    Answer:  1 mg    Order Specific Question:  CIWA-AR 11 -15 =    Answer:  2 mg    Order Specific Question:  CIWA-AR 16 -24 =    Answer:  2 mg    Order Specific Question:  CIWA-AR 16 -24 =    Answer:  Recheck CIWA-AR in 1 hour; if > 15 notify MD    Order Specific Question:  CIWA-AR > 24 =    Answer:  4 mg    Order Specific Question:  CIWA-AR > 24 =    Answer:  Call Rapid Response   Followed by  . LORazepam (ATIVAN) tablet 0-4 mg    Sig:     Order Specific Question:  CIWA-AR < 5 =    Answer:  0    Order Specific Question:  CIWA-AR 5 -10 =    Answer:  1 mg    Order Specific Question:  CIWA-AR 11 -15 =    Answer:  2 mg    Order Specific Question:  CIWA-AR 16 -24 =    Answer:  2 mg    Order Specific Question:  CIWA-AR 16 -24 =    Answer:  Recheck CIWA-AR in 1 hour; if > 15 notify MD    Order Specific Question:  CIWA-AR > 24 =    Answer:  4 mg    Order Specific Question:  CIWA-AR > 24 =    Answer:  Call Rapid Response  . thiamine (VITAMIN B-1) tablet 100 mg    Sig:    Or  . thiamine (B-1) injection 100 mg    Sig:   . acetaminophen (TYLENOL) tablet 650 mg    Sig:   . ibuprofen (ADVIL,MOTRIN) tablet 600 mg    Sig:   . zolpidem (AMBIEN) tablet 5 mg    Sig:   . nicotine (NICODERM CQ - dosed in mg/24 hours) patch 21 mg    Sig:   . ondansetron (ZOFRAN) tablet 4 mg    Sig:   . alum & mag hydroxide-simeth (MAALOX/MYLANTA) 200-200-20 MG/5ML suspension 30 mL    Sig:        Donnita [place], PA-C 09/30/13 [phone]

## 2013-09-30 NOTE — Progress Notes (Signed)
Patient ID: Katelyn Bridges, female   DOB: 1980-06-10, 33 y.o.   MRN: [id number]  [name] is a 33 year old female admitted IVC to [facility] for suicidal ideation and depression. Per report patient stated that she wanted to fall asleep and never wake up. Patient recently broke up with her boyfriend and her dog died. Patient denies pain at this time and any thoughts to harm herself or others, no SI/HI and A/V hallucinations. Patient has past medical history of depression, anxiety, and seizures (record does not specify date of seizure). Patient is anxious with a depressed mood. Patient was oriented to the unit and verbalized understanding of the admission process. Patient was given PRN Vistaril for anxiety. Q15 minute safety checks were initiated and are maintained. Patient is safe at this time.

## 2013-09-30 NOTE — ED Notes (Signed)
Patient belongings 1 bag, placed in TCU locker # [phone]

## 2013-09-30 NOTE — ED Provider Notes (Signed)
Medical screening examination/treatment/procedure(s) were performed by non-physician practitioner and as supervising physician I was immediately available for consultation/collaboration.   EKG Interpretation None        Edrees Valent L Kieu Quiggle, MD 09/30/13 0801 

## 2013-09-30 NOTE — ED Notes (Signed)
PT presented with IVC papers as Katelyn Bridges and was registered as such; pt was registered under this chart; please see Katelyn Bridges for lab results

## 2013-09-30 NOTE — ED Notes (Addendum)
Pt not in department, chart needs to be merged

## 2013-09-30 NOTE — ED Notes (Addendum)
Pt belongings: colored pants, black tank tops, brown flip flops, brown purse(wallet, passport, make up)

## 2013-09-30 NOTE — ED Notes (Signed)
Pt moved to TCU; denies any c/o distress; calm and pleasant; states is not suicidal; states was a misunderstanding with her family; states recent breakup 3 wks ago with her boyfriend and she had to move back home from [place]; states she told her room mate she wanted to go to sleep and never wake up; pt states she has no intent of hurting herself; sitter at the bedside
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
## 2013-09-30 NOTE — ED Notes (Signed)
Pt arrives to the ER under IVC with [employer]; pt states that she recently went through a break up and that she has been drinking daily since the break up; pt states that she made the statement to her room mate that she wished she would not wake up in the morning and the roommate called her parents, who took out IVC papers; per [employer] there were numerous statements about wanting to die and not waking that were made via text message and to her roommate and parents; pt denies SI currently; pt states that it was a statement that she made that everyone is taking out of context.

## 2013-09-30 NOTE — Progress Notes (Signed)
D: Patient in the bed on approach.  Patient states she is feeling ok but states she is still getting acclimated to the unit.  Patient states she does not currently have a goal.  Patient denies SI/HI and denies AVH.   A: Staff to monitor Q 15 mins for safety.  Encouragement and support offered.  Scheduled medications administered per orders. R: Patient remains safe on the unit.  Patient did not attend group tonight.  Patient not visible on the unit tonight.  Patient taking administered medications.

## 2013-09-30 NOTE — ED Notes (Signed)
Charge RN spoke with [facility], pt needs assessment.

## 2013-09-30 NOTE — Progress Notes (Signed)
[facility] Community Liaison Stacy,  ° °Provided pt with a [facility] Orange Card application, highlighting [facility], to help patient establish primary care.  °

## 2013-09-30 NOTE — Tx Team (Signed)
Initial Interdisciplinary Treatment Plan   PATIENT STRESSORS: Loss of boyfriend   PROBLEM LIST: Problem List/Patient Goals Date to be addressed Date deferred Reason deferred Estimated date of resolution  Depression 09/30/2013           Anxiety 09/30/2013           Risk for suicide 09/30/2013                              DISCHARGE CRITERIA:  Improved stabilization in mood, thinking, and/or behavior Verbal commitment to aftercare and medication compliance  PRELIMINARY DISCHARGE PLAN: Attend PHP/IOP Outpatient therapy  PATIENT/FAMIILY INVOLVEMENT: This treatment plan has been presented to and reviewed with the patient, Katelyn Bridges.  The patient and family have been given the opportunity to ask questions and make suggestions.  Marzetta Board E 09/30/2013, 3:07 PM

## 2013-09-30 NOTE — ED Notes (Signed)
Patient resting comfortably, denies any pain or nausea. NT at bedside for lab draw. Patient has no needs at this time.

## 2013-09-30 NOTE — ED Notes (Signed)
Called report to Occupational psychologist at [facility]. Will call Sheriff to have patient transported since patient is IVC'd.

## 2013-09-30 NOTE — BHH Counselor (Signed)
Accepted to [facility] by Nanine Means, NP to [facility]. The room assignment is 301-2. Nursing report # is [phone]. Patient under IVC. Sheriff will transport.

## 2013-10-01 DIAGNOSIS — F3289 Other specified depressive episodes: Secondary | ICD-10-CM

## 2013-10-01 DIAGNOSIS — F1020 Alcohol dependence, uncomplicated: Principal | ICD-10-CM

## 2013-10-01 DIAGNOSIS — F329 Major depressive disorder, single episode, unspecified: Secondary | ICD-10-CM

## 2013-10-01 MED ORDER — CHLORDIAZEPOXIDE HCL 25 MG PO CAPS
25.0000 mg | ORAL_CAPSULE | Freq: Four times a day (QID) | ORAL | Status: DC | PRN
  Administered 2013-10-01 – 2013-10-02 (×3): 25 mg via ORAL
  Filled 2013-10-01 (×4): qty 1

## 2013-10-01 MED ORDER — ADULT MULTIVITAMIN W/MINERALS CH
1.0000 | ORAL_TABLET | Freq: Every day | ORAL | Status: DC
  Administered 2013-10-01 – 2013-10-03 (×3): 1 via ORAL
  Filled 2013-10-01 (×6): qty 1

## 2013-10-01 MED ORDER — VITAMIN B-1 100 MG PO TABS
100.0000 mg | ORAL_TABLET | Freq: Every day | ORAL | Status: DC
  Administered 2013-10-01 – 2013-10-03 (×3): 100 mg via ORAL
  Filled 2013-10-01 (×6): qty 1
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
## 2013-10-01 NOTE — [facility] Suicide Risk Assessment (Signed)
[facility] INPATIENT:  Family/Significant Other Suicide Prevention Education  Suicide Prevention Education:  Patient Refusal for Family/Significant Other Suicide Prevention Education: The patient Katelyn Bridges has refused to provide written consent for family/significant other to be provided Family/Significant Other Suicide Prevention Education during admission and/or prior to discharge.  Physician notified.  Harriette Tovey, [place] 10/01/2013, 12:03 PM

## 2013-10-01 NOTE — Tx Team (Addendum)
Interdisciplinary Treatment Plan Update (Adult) Date: 10/01/2013   Time Reviewed: 9:30 AM  Progress in Treatment: Attending groups: CSW assessing, patient new to milieu Participating in groups: CSW assessing, patient new to milieu Taking medication as prescribed: Yes Tolerating medication: Yes Family/Significant other contact made: No, CSW continuing to assess Patient understands diagnosis: Yes Discussing patient identified problems/goals with staff: Yes Medical problems stabilized or resolved: Yes Denies suicidal/homicidal ideation: CSW assessing Issues/concerns per patient self-inventory: Yes Other:  New problem(s) identified: N/A  Discharge Plan or Barriers: CSW continuing to assess. Patient new to milieu.  Reason for Continuation of Hospitalization:  Depression Anxiety Medication Stabilization   Comments: N/A  Estimated length of stay: 3-5 days  For review of initial/current patient goals, please see plan of care.  Attendees: Patient:    Family:    Physician: Dr. Jama Flavors 10/01/2013 9:30 AM  Nursing: [place]ers; Kathi Simpers; Michaelle Birks, RN 10/01/2013 9:30 AM  Clinical Social Worker: Samuella Bruin,  LCSWA 10/01/2013 9:30 AM  Other: Juline Patch, LCSW 10/01/2013 9:30 AM  Other: Leisa Lenz, Vesta Mixer Liaison 10/01/2013 9:30 AM  Other: Onnie Boer, Case Manager 10/01/2013 9:30 AM  Other:  10/01/2013 9:30 AM  Other:  10/01/2013 9:30 AM  Other:    Other:    Other:    Other:           Scribe for Treatment Team:  Samuella Bruin, MSW, [employer] [phone]
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
## 2013-10-01 NOTE — Progress Notes (Signed)
Patient ID: Katelyn Bridges, female   DOB: 09-Nov-1980, 33 y.o.   MRN: [id number] She has been up and about interacting with peers and staff. Requested and received prn medication fr headache that was effective. . Self Inventory: depression 5. Hopelessness 3, anxiety 7 denies withdrawal symptoms and SI. Complain of back pain she thinks is from the bed. Goal today:  Work on feeling good about myself again. By going to groups.

## 2013-10-01 NOTE — Progress Notes (Signed)
D:Patient int the dayroom on approach.  Patient appears brighter today.  Patient complained she felt she had mild tremors.  Patient states she had a good day and states he goal for today was to attend all of the groups.  Patient states she did not meet it because she attended all but one group.  Patient states she will try again tomorrow.  Patient denies SI/HI and denies AVH. A: Staff to monitor Q 15 mins for safety.  Encouragement and support offered.  Scheduled medications administered per orders.  Librium administered prn or withdrawal and vistaril prn administered for anxiety. R: Patient remains safe on the unit.  Patient attended group tonight.  Patient visible on the unit and interacting with peers.  Patient taking administered medications.

## 2013-10-01 NOTE — [facility] Group Notes (Signed)
[facility] LCSW Group Therapy 10/01/2013  1:15 PM   Type of Therapy: Group Therapy  Participation Level: Did Not Attend.   Samuella Bruin, MSW, [employer] Clinical Social Worker [facility] [phone]

## 2013-10-01 NOTE — Progress Notes (Signed)
Recreation Therapy Notes   Animal-Assisted Activity/Therapy (AAA/T) Program Checklist/Progress Notes Patient Eligibility Criteria Checklist & Daily Group note for Rec Tx Intervention  Date: 09.22.2015 Time: 2:45pm Location: [address] Dayroom    AAA/T Program Assumption of Risk Form signed by Patient/ or Parent Legal Guardian yes  Patient is free of allergies or sever asthma yes  Patient reports no fear of animals yes  Patient reports no history of cruelty to animals yes   Patient understands his/her participation is voluntary yes  Behavioral Response: Did not attend.   Xylina Rhoads L Imonie Tuch, LRT/CTRS  Layni Kreamer L 10/01/2013 3:30 PM 

## 2013-10-01 NOTE — [facility] Suicide Risk Assessment (Signed)
   Nursing information obtained from:  Patient Demographic factors:  Caucasian Current Mental Status:  NA Loss Factors:  Loss of significant relationship (boyfriend, dog) Historical Factors:  Impulsivity Risk Reduction Factors:  Sense of responsibility to family Total Time spent with patient: 45 minutes  CLINICAL FACTORS:  Depression, Alcohol Abuse   Psychiatric Specialty Exam: Physical Exam  ROS  Blood pressure 140/85, pulse 80, temperature 98.9 F (37.2 C), temperature source Oral, resp. rate 16, height  (1.651 m), weight 65.998 kg (145 lb 8 oz), last menstrual period 09/24/2013, SpO2 98.00%.Body mass index is 24.21 kg/(m^2).  SEE ADMIT NOTE MSE   COGNITIVE FEATURES THAT CONTRIBUTE TO RISK:  Closed-mindedness    SUICIDE RISK:   Moderate:  Frequent suicidal ideation with limited intensity, and duration, some specificity in terms of plans, no associated intent, good self-control, limited dysphoria/symptomatology, some risk factors present, and identifiable protective factors, including available and accessible social support.  PLAN OF CARE: Patient will be admitted to inpatient psychiatric unit for stabilization and safety. Will provide and encourage milieu participation. Provide medication management and maked adjustments as needed.   Will also provide medication management to address potential alcohol withdrawal. Will follow daily.    I certify that inpatient services furnished can reasonably be expected to improve the patient's condition.  Kanae Ignatowski 10/01/2013, 12:56 PM

## 2013-10-02 DIAGNOSIS — F329 Major depressive disorder, single episode, unspecified: Secondary | ICD-10-CM

## 2013-10-02 DIAGNOSIS — F411 Generalized anxiety disorder: Secondary | ICD-10-CM

## 2013-10-02 MED ORDER — TRAZODONE HCL 50 MG PO TABS
50.0000 mg | ORAL_TABLET | Freq: Every evening | ORAL | Status: DC | PRN
  Administered 2013-10-02: 50 mg via ORAL
  Filled 2013-10-02: qty 14
  Filled 2013-10-02: qty 1
  Filled 2013-10-02: qty 14

## 2013-10-02 NOTE — BHH Group Notes (Signed)
   [facility] LCSW Aftercare Discharge Planning Group Note  10/02/2013  8:45 AM   Participation Quality: Alert, Appropriate and Oriented  Mood/Affect: Appropriate  Depression Rating: 3   Anxiety Rating: 7  Thoughts of Suicide: Pt denies SI/HI  Will you contract for safety? Yes  Current AVH: Pt denies  Plan for Discharge/Comments: Pt attended discharge planning group and actively participated in group. CSW provided pt with today's workbook. Patient reports feeling "better" today and that she has been sleeping well. She plans to return to live with her friend at discharge and follow up with [facility] [place] for outpatient services.  Transportation Means: Pt reports access to transportation  Supports: Some family support per patient  Samuella Bruin, MSW, [employer] Clinical Social Worker [employer] [phone]

## 2013-10-02 NOTE — Progress Notes (Signed)
D: Patient in the dayroom on approach. Patient states she had a good day.  Patient states she attended most groups today.  Patient states she did have some withdrawal symptoms today and had to take Librium.  Patient states she wants to get her life together.  Patient denies SI/HI and denies AVH. A: Staff to monitor Q 15 mins for safety.  Encouragement and support offered.  Scheduled medications administered per orders. R: Patient remains safe on the unit.  Patient attended group tonight.  Patient visible on the unit and interacting with peers.  Patient taking administered medications.

## 2013-10-02 NOTE — Progress Notes (Signed)
[facility] MD Progress Note  10/02/2013 12:01 PM Katelyn Bridges  MRN:  [id number] Subjective:   Patient states she is feeling " better". She describes anxiety, and states that at least partly this is related to cigarette cravings.  Objective:  At this time patient presents calm, pleasant, and with improved mood. She does state she feels subjectively anxious, and attributes this to smoking cessation on unit. Although she is on nicotine replacement via patch, she states " I want a cigarette real bad". We discussed benefits of smoking cessation, and patient states she has stopped smoking in the past, but relapsed recently due to increased psychosocial stressors and tension with boyfriend. She is not currently presenting with any alcohol withdrawal symptoms- no tremors, no diaphoresis, no facial flushing, vitals stable, no restlessness. Denies ETOH cravings. Behavior on unit in good control, going to groups/ participative in milieu. Diagnosis:  MDD, Alcohol Dependence, Anxiety Disorder NOS  Total Time spent with patient: 25 minutes     ADL's:  Improved   Sleep: improved   Appetite:  Improved   Suicidal Ideation:  Denies suicidal ideations at this time  Homicidal Ideation:  Denies  AEB (as evidenced by):  Psychiatric Specialty Exam: Physical Exam  Review of Systems  Constitutional: Negative for fever and chills.  Respiratory: Negative for cough and shortness of breath.   Cardiovascular: Negative for chest pain.  Gastrointestinal: Negative for nausea, vomiting and abdominal pain.  Neurological: Negative for tremors.  Psychiatric/Behavioral: The patient is nervous/anxious.     Blood pressure 117/72, pulse 60, temperature 98.9 F (37.2 C), temperature source Oral, resp. rate 18, height 5' 5"  (1.651 m), weight 65.998 kg (145 lb 8 oz), last menstrual period 09/24/2013, SpO2 98.00%.Body mass index is 24.21 kg/(m^2).  General Appearance: Well Groomed  Engineer, water::  Good  Speech:   Normal Rate  Volume:  Normal  Mood:  improved compared to admission  Affect:  Appropriate and Congruent  Thought Process:  Goal Directed and Linear  Orientation:  Full (Time, Place, and Person)  Thought Content:  denies hallucinations,no delusions  Suicidal Thoughts:  No  Homicidal Thoughts:  No  Memory:  recent and remote grossly intact  Judgement:  Good  Insight:  Fair  Psychomotor Activity:  Normal  Concentration:  Good  Recall:  Good  Fund of Knowledge:Good  Language: Good  Akathisia:  No  Handed:  Right  AIMS (if indicated):     Assets:  Communication Skills Desire for Improvement Resilience  Sleep:  Number of Hours: 6.5   Musculoskeletal: Strength & Muscle Tone: within normal limits Gait & Station: normal Patient leans: N/A  Current Medications: Current Facility-Administered Medications  Medication Dose Route Frequency Provider Last Rate Last Dose  . acetaminophen (TYLENOL) tablet 650 mg  650 mg Oral Q6H PRN Waylan Boga, NP      . alum & mag hydroxide-simeth (MAALOX/MYLANTA) 200-200-20 MG/5ML suspension 30 mL  30 mL Oral Q4H PRN Waylan Boga, NP      . chlordiazePOXIDE (LIBRIUM) capsule 25 mg  25 mg Oral QID PRN Neita Garnet, MD   25 mg at 10/02/13 1124  . FLUoxetine (PROZAC) capsule 20 mg  20 mg Oral Daily Waylan Boga, NP   20 mg at 10/02/13 0823  . hydrOXYzine (ATARAX/VISTARIL) tablet 25 mg  25 mg Oral Q6H PRN Encarnacion Slates, NP   25 mg at 10/02/13 0826  . magnesium hydroxide (MILK OF MAGNESIA) suspension 30 mL  30 mL Oral Daily PRN Waylan Boga, NP      .  multivitamin with minerals tablet 1 tablet  1 tablet Oral Daily Neita Garnet, MD   1 tablet at 10/02/13 [phone]  . nicotine (NICODERM CQ - dosed in mg/24 hours) patch 21 mg  21 mg Transdermal Daily Nicholaus Bloom, MD   21 mg at 10/02/13 0824  . QUEtiapine (SEROQUEL) tablet 50 mg  50 mg Oral QHS Waylan Boga, NP   50 mg at 10/01/13 2244  . thiamine (VITAMIN B-1) tablet 100 mg  100 mg Oral Daily Neita Garnet, MD    100 mg at 10/02/13 4010   Facility-Administered Medications Ordered in Other Encounters  Medication Dose Route Frequency Provider Last Rate Last Dose  . HYDROmorphone (DILAUDID) injection 2 mg  2 mg Intravenous Once Martie Lee, PA-C        Lab Results:  Results for orders placed during the hospital encounter of 09/30/13 (from the past 48 hour(s))  ACETAMINOPHEN LEVEL     Status: None   Collection Time    09/30/13  1:10 PM      Result Value Ref Range   Acetaminophen (Tylenol), Serum <15.0  10 - 30 ug/mL   Comment:            THERAPEUTIC CONCENTRATIONS VARY     SIGNIFICANTLY. A RANGE OF 10-30     ug/mL MAY BE AN EFFECTIVE     CONCENTRATION FOR MANY PATIENTS.     HOWEVER, SOME ARE BEST TREATED     AT CONCENTRATIONS OUTSIDE THIS     RANGE.     ACETAMINOPHEN CONCENTRATIONS     >150 ug/mL AT 4 HOURS AFTER     INGESTION AND >50 ug/mL AT 12     HOURS AFTER INGESTION ARE     OFTEN ASSOCIATED WITH TOXIC     REACTIONS.  CBC     Status: Abnormal   Collection Time    09/30/13  1:10 PM      Result Value Ref Range   WBC 10.8 (*) 4.0 - 10.5 K/uL   RBC 4.64  3.87 - 5.11 MIL/uL   Hemoglobin 14.8  12.0 - 15.0 g/dL   HCT 43.4  36.0 - 46.0 %   MCV 93.5  78.0 - 100.0 fL   MCH 31.9  26.0 - 34.0 pg   MCHC 34.1  30.0 - 36.0 g/dL   RDW 13.3  11.5 - 15.5 %   Platelets 281  150 - 400 K/uL  COMPREHENSIVE METABOLIC PANEL     Status: Abnormal   Collection Time    09/30/13  1:10 PM      Result Value Ref Range   Sodium 139  137 - 147 mEq/L   Potassium 4.4  3.7 - 5.3 mEq/L   Chloride 100  96 - 112 mEq/L   CO2 26  19 - 32 mEq/L   Glucose, Bld 98  70 - 99 mg/dL   BUN 8  6 - 23 mg/dL   Creatinine, Ser 0.58  0.50 - 1.10 mg/dL   Calcium 9.2  8.4 - 10.5 mg/dL   Total Protein 7.5  6.0 - 8.3 g/dL   Albumin 3.9  3.5 - 5.2 g/dL   AST 43 (*) 0 - 37 U/L   ALT 40 (*) 0 - 35 U/L   Alkaline Phosphatase 87  39 - 117 U/L   Total Bilirubin 0.6  0.3 - 1.2 mg/dL   GFR calc non Af Amer >90  >90 mL/min   GFR  calc Af Amer >90  >90 mL/min  Comment: (NOTE)     The eGFR has been calculated using the CKD EPI equation.     This calculation has not been validated in all clinical situations.     eGFR's persistently <90 mL/min signify possible Chronic Kidney     Disease.   Anion gap 13  5 - 15  SALICYLATE LEVEL     Status: Abnormal   Collection Time    09/30/13  1:10 PM      Result Value Ref Range   Salicylate Lvl <3.7 (*) 2.8 - 20.0 mg/dL    Physical Findings: AIMS: Facial and Oral Movements Muscles of Facial Expression: None, normal Lips and Perioral Area: None, normal Jaw: None, normal Tongue: None, normal,Extremity Movements Upper (arms, wrists, hands, fingers): None, normal Lower (legs, knees, ankles, toes): None, normal, Trunk Movements Neck, shoulders, hips: None, normal, Overall Severity Severity of abnormal movements (highest score from questions above): None, normal Incapacitation due to abnormal movements: None, normal Patient's awareness of abnormal movements (rate only patient's report): No Awareness, Dental Status Current problems with teeth and/or dentures?: No Does patient usually wear dentures?: No  CIWA:    COWS:     Assessment: Patient is improving. She is anxious, partly related to nicotine cravings.She is not presenting with any alcohol withdrawal symptoms at this time. She is tolerating medications well at present. There are no current suicidal ideations and behavior is in good control. We discussed medication issues- wellbutrin may be helpful for nicotine cessation efforts, but  Patient is concerned that it may increase anxiety. As no history of psychosis or any clear history of bipolarity, will D/C Seroquel and replace with Trazodone for sleep on a PRN basis.    Treatment Plan Summary: Daily contact with patient to assess and evaluate symptoms and progress in treatment Medication management See below  Plan: Continue inpatient treatment , milieu. We discussed  importance of smoking cessation, and will continue nicotine replacement therapy/ patch Continue Prozac 20 mgrs QDAY Discontinue Seroquel  Start Trazodone 50 mgrs QHS PRN Insomnia.   Medical Decision Making Problem Points:  Established problem, stable/improving (1), Review of last therapy session (1) and Review of psycho-social stressors (1) Data Points:  Review of medication regiment & side effects (2) Review of new medications or change in dosage (2)  I certify that inpatient services furnished can reasonably be expected to improve the patient's condition.   Zekiah Caruth, [place] 10/02/2013, 12:01 PM

## 2013-10-02 NOTE — Progress Notes (Signed)
Adult Psychoeducational Group Note  Date:  10/02/2013 Time:  10:00 am  Group Topic/Focus:  Wellness Toolbox:   The focus of this group is to discuss various aspects of wellness, balancing those aspects and exploring ways to increase the ability to experience wellness.  Patients will create a wellness toolbox for use upon discharge.  Participation Level:  Active  Participation Quality:  Appropriate, Sharing and Supportive  Affect:  Appropriate  Cognitive:  Appropriate  Insight: Appropriate  Engagement in Group:  Engaged  Modes of Intervention:  Discussion, Education, Socialization and Support  Additional Comments: Pt stated that when she is exercising, shopping and painting that she is doing well. Pt stated that when she is sleeping a lot, drinking alcohol and isolating herself that she is not doing well.   Trine Fread 10/02/2013, 10:29 AM

## 2013-10-02 NOTE — [facility] Group Notes (Signed)
[facility] LCSW Group Therapy 10/02/2013  1:15 PM Type of Therapy: Group Therapy Participation Level: Active  Participation Quality: Attentive, Sharing and Supportive  Affect: Depressed and Flat  Cognitive: Alert and Oriented  Insight: Developing/Improving and Engaged  Engagement in Therapy: Developing/Improving and Engaged  Modes of Intervention: Clarification, Confrontation, Discussion, Education, Exploration, Limit-setting, Orientation, Problem-solving, Rapport Building, Dance movement psychotherapist, Socialization and Support  Summary of Progress/Problems: The topic for group today was emotional regulation. This group focused on both positive and negative emotion identification and allowed group members to process ways to identify feelings, regulate negative emotions, and find healthy ways to manage internal/external emotions. Group members were asked to reflect on a time when their reaction to an emotion led to a negative outcome and explored how alternative responses using emotion regulation would have benefited them. Group members were also asked to discuss a time when emotion regulation was utilized when a negative emotion was experienced. Patient shared that she would like to have better control over her anxiety. Patient was able to identify that her anxiety is related to several recent changes in her life including a recent breakup, [place], and the death of her dog. CSW's and other group members provided emotional support and encouragement. Visual guidance exercise was practiced in group, patients encouraged to find their "safe place".  Samuella Bruin, MSW, [employer] Clinical Social Worker [facility] [phone]

## 2013-10-02 NOTE — Progress Notes (Signed)
NUTRITION ASSESSMENT  Pt identified as at risk on the Malnutrition Screen Tool  INTERVENTION: 1. Educated patient on the importance of nutrition and encouraged intake of food and beverages.  "Sobriety and Nutrition" handout provided. 2. Discussed weight goals. 3. Supplements: MVI and thiamine daily  NUTRITION DIAGNOSIS: Unintentional weight loss related to sub-optimal intake as evidenced by pt report.   Goal: Pt to meet >/= 90% of their estimated nutrition needs.  Monitor:  PO intake  Assessment:  Patient reports good intake currently and appetite is improving.  Lost weight from UBW of 155 lbs to 145 lbs in the past month due to increased stress and etoh abuse.    33 y.o. female  Height: Ht Readings from Last 1 Encounters:  09/30/13  (1.651 m)    Weight: Wt Readings from Last 1 Encounters:  09/30/13 145 lb 8 oz (65.998 kg)    Weight Hx: Wt Readings from Last 10 Encounters:  09/30/13 145 lb 8 oz (65.998 kg)  03/02/11 135 lb (61.236 kg)  06/15/10 138 lb (62.596 kg)  08/13/09 141 lb 8 oz (64.184 kg)  05/20/09 153 lb (69.4 kg)  03/24/08 148 lb (67.132 kg)    BMI:  Body mass index is 24.21 kg/(m^2). Pt meets criteria for normal weight based on current BMI.  Estimated Nutritional Needs: Kcal: 25-30 kcal/kg Protein: > 1 gram protein/kg Fluid: 1 ml/kcal  Diet Order: Cardiac Pt is also offered choice of unit snacks mid-morning and mid-afternoon.  Pt is eating as desired.   Lab results and medications reviewed.   Oran Rein, RD, LDN Clinical Inpatient Dietitian Pager:  [phone] Weekend and after hours pager:  [phone]

## 2013-10-02 NOTE — Progress Notes (Signed)
Patient ID: Katelyn Bridges, female   DOB: 1980/10/19, 33 y.o.   MRN: [id number] She has  Been up for short periods of time then back to bed saying she was tired because she had came in late last night. She has had prn medications for anxiety, w/d,s today that was helpful. Self inventory: depression 3, hopelessness 3, anxiety 5, w/d's 0, no SI.

## 2013-10-03 LAB — URINE MICROSCOPIC-ADD ON

## 2013-10-03 LAB — URINALYSIS, ROUTINE W REFLEX MICROSCOPIC
GLUCOSE, UA: NEGATIVE mg/dL
HGB URINE DIPSTICK: NEGATIVE
KETONES UR: NEGATIVE mg/dL
Leukocytes, UA: NEGATIVE
Nitrite: NEGATIVE
PROTEIN: NEGATIVE mg/dL
Specific Gravity, Urine: 1.031 — ABNORMAL HIGH (ref 1.005–1.030)
Urobilinogen, UA: 1 mg/dL (ref 0.0–1.0)
pH: 5.5 (ref 5.0–8.0)

## 2013-10-03 MED ORDER — FLUOXETINE HCL 20 MG PO CAPS: 20.0000 mg | ORAL_CAPSULE | Freq: Every day | ORAL | Status: DC

## 2013-10-03 MED ORDER — CIPROFLOXACIN HCL 500 MG PO TABS: 500.0000 mg | ORAL_TABLET | Freq: Two times a day (BID) | ORAL | Status: DC

## 2013-10-03 MED ORDER — HYDROXYZINE HCL 25 MG PO TABS
25.0000 mg | ORAL_TABLET | Freq: Three times a day (TID) | ORAL | Status: DC | PRN
  Filled 2013-10-03 (×2): qty 30

## 2013-10-03 MED ORDER — TRAZODONE HCL 50 MG PO TABS: 50.0000 mg | ORAL_TABLET | Freq: Every evening | ORAL | Status: DC | PRN

## 2013-10-03 MED ORDER — CIPROFLOXACIN HCL 500 MG PO TABS
500.0000 mg | ORAL_TABLET | Freq: Two times a day (BID) | ORAL | Status: DC
  Administered 2013-10-03: 500 mg via ORAL
  Filled 2013-10-03 (×2): qty 1
  Filled 2013-10-03 (×2): qty 5
  Filled 2013-10-03: qty 1
  Filled 2013-10-03: qty 2

## 2013-10-03 MED ORDER — HYDROXYZINE HCL 25 MG PO TABS: ORAL_TABLET | ORAL | Status: DC

## 2013-10-03 NOTE — B[facility] Group Notes (Signed)
[facility] LCSW Group Therapy 10/03/2013 1:15 PM Type of Therapy: Group Therapy Participation Level: Active  Participation Quality: Attentive, Sharing and Supportive  Affect: Depressed and Flat  Cognitive: Alert and Oriented  Insight: Developing/Improving and Engaged  Engagement in Therapy: Developing/Improving and Engaged  Modes of Intervention: Activity, Clarification, Confrontation, Discussion, Education, Exploration, Limit-setting, Orientation, Problem-solving, Rapport Building, Reality Testing, Socialization and Support  Summary of Progress/Problems: Patient was attentive and engaged with speaker from [employer]. Patient was attentive to speaker while they shared their story of dealing with mental health and overcoming it. Patient expressed interest in their programs and services and received information on their agency. Patient processed ways they can relate to the speaker.   Penelopi Mikrut, MSW, LCSWA Clinical Social Worker [facility] [phone]   

## 2013-10-03 NOTE — BHH Suicide Risk Assessment (Signed)
Demographic Factors:  33 year old female, recently moved from [place] to [place], has no children, lives with roomate   Total Time spent with patient: 30 minutes  Psychiatric Specialty Exam: Physical Exam  ROS  Blood pressure 103/70, pulse 67, temperature 97.9 F (36.6 C), temperature source Oral, resp. rate 20, height  (1.651 m), weight 65.998 kg (145 lb 8 oz), last menstrual period 09/24/2013, SpO2 98.00%.Body mass index is 24.21 kg/(m^2).  General Appearance: Well Groomed  Patent attorney::  Good  Speech:  Normal Rate  Volume:  Normal  Mood:  Euthymic  Affect:  Appropriate  Thought Process:  Goal Directed and Linear  Orientation:  Full (Time, Place, and Person)  Thought Content:  no hallucinations, no delusions  Suicidal Thoughts:  No  Homicidal Thoughts:  No  Memory:  recent and remote grossly intact   Judgement:  Good  Insight:  Fair  Psychomotor Activity:  Normal  Concentration:  Good  Recall:  Good  Fund of Knowledge:Good  Language: Good  Akathisia:  Negative  Handed:  Right  AIMS (if indicated):     Assets:  Communication Skills Desire for Improvement Physical Health Resilience  Sleep:  Number of Hours: 6    Musculoskeletal: Strength & Muscle Tone: within normal limits-  At this time no symptoms of withdrawal, no tremors, no restlessness Gait & Station: normal Patient leans: N/A   Mental Status Per Nursing Assessment::   On Admission:  NA  Current Mental Status by Physician: At this time patient is improved, with euthymic mood and fuller range of affect. No thought disorder, no SI or HI, no psychotic symptoms, future oriented.  Loss Factors: recent break up, recent relocation, current unemployment  Historical Factors: History of alcohol use disorder, denies history of suicidal attempts or self injurious behaviors.   Risk Reduction Factors:   Sense of responsibility to family, Living with another person, especially a relative, Positive  social support and Positive coping skills or problem solving skills  Continued Clinical Symptoms:  At this time patient is improved, presents euthymic, with full range of affect, and is not suicidal or homicidal, future oriented.  Cognitive Features That Contribute To Risk:  Currently alert and attentive and oriented x 3- no gross cognitive deficits noted   Suicide Risk:  Mild:  Suicidal ideation of limited frequency, intensity, duration, and specificity.  There are no identifiable plans, no associated intent, mild dysphoria and related symptoms, good self-control (both objective and subjective assessment), few other risk factors, and identifiable protective factors, including available and accessible social support.  Discharge Diagnoses:   AXIS I: Depression NOS, consider MDD versus alcohol induced mood disorder, depressed. Alcohol Dependence.   AXIS II:  Deferred AXIS III:   Past Medical History  Diagnosis Date  . ANXIETY 05/20/2009  . DEPRESSION/ANXIETY 03/24/2008  . DEPRESSION 05/20/2009  . INSOMNIA-SLEEP DISORDER-UNSPEC 03/24/2008  . PSORIASIS 03/24/2008  . SKIN RASH 08/13/2009  . Seizure    AXIS IV:  recent break up with boyfriend, recent relocation, current unemployment AXIS V:  61-70 mild symptoms  Plan Of Care/Follow-up recommendations:  Activity:  As tolerated Diet:  regular Tests:  NA Other:  See below  Is patient on multiple antipsychotic therapies at discharge:  No   Has Patient had three or more failed trials of antipsychotic monotherapy by history:  No  Recommended Plan for Multiple Antipsychotic Therapies: NA  Patient is leaving our unit in good spirits. She plans to return to her current home in [place]  to follow up at [facility] [place]. Encouraged to go to AA and to abstain from alcohol.   Onyekachi Gathright 10/03/2013, 9:45 AM

## 2013-10-03 NOTE — Progress Notes (Signed)
Patient ID: Katelyn Bridges, female   DOB: 09/11/1980, 33 y.o.   MRN: [id number] She has been up and to groups interacting with peers and staff. C/O UTI burning and pain, urine obtained and she was put on ABT.  Self inventory: depression 3, hopelessness 3, anxiety 5, and she denies SI thoughts.  Goal : Wants to know how to manage anxiety and emotions.

## 2013-10-03 NOTE — Progress Notes (Signed)
[facility] Adult Case Management Discharge Plan :  Will you be returning to the same living situation after discharge: Yes,  patient plans to return home with her roommate At discharge, do you have transportation home?:Yes,  patient reports having transportation home Do you have the ability to pay for your medications:Yes,  patient will be provided with necessary medication samples and prescriptions at discharge.  Release of information consent forms completed and in the chart;  Patient's signature needed at discharge.  Patient to Follow up at: Follow-up Information   Follow up with [facility]. (Please present for intake assessment for therapy and medication management services between Monday - Friday 8 am- 12 pm or 1 pm - 2:30 pm. Please call office if you have any questions.)    Specialty:  Professional Counselor   Contact information:   [facility] [place] [address] [place] [address] [phone]       Patient denies SI/HI:   Yes,  denies    Safety Planning and Suicide Prevention discussed:  Yes,  with patient  Zayna Toste, [place] 10/03/2013, 10:00 AM

## 2013-10-03 NOTE — [facility]HH Group Notes (Signed)
0900 nursing orientation group   The focus of this group is to educate the patient on the purpose and policies of crisis stabilization and provide a format to answer questions about their admission.  The group details unit policies and expectations of patients while admitted.   Pt was an active participant in group and was appropriate in sharing.  

## 2013-10-03 NOTE — Progress Notes (Signed)
Patient ID: Katelyn Bridges, female   DOB: 08/08/1980, 33 y.o.   MRN: [id number] Pt discharged at this time. Pt denies SI/HI. Pt was provided with discharge instructions and pt verbalized understanding. Pt provided with prescriptions and supply of medications. All belongings returned.

## 2013-10-03 NOTE — Discharge Summary (Signed)
Physician Discharge Summary Note  Patient:  Katelyn Bridges is an 33 y.o., female MRN:  [id number] DOB:  June 26, 1980 Patient phone:  [phone] (home)  Patient address:   [place] [address],  Total Time spent with patient: Greater than 30 minutes  Date of Admission:  09/30/2013  Date of Discharge: 10/03/13  Reason for Admission:  Alcohol detox  Discharge Diagnoses: Active Problems:   Alcohol dependence   Psychiatric Specialty Exam: Physical Exam  Psychiatric: Her speech is normal and behavior is normal. Judgment and thought content normal. Her mood appears not anxious. Her affect is not angry, not blunt, not labile and not inappropriate. Cognition and memory are normal. She does not exhibit a depressed mood.    Review of Systems  Constitutional: Negative.   HENT: Negative.   Eyes: Negative.   Respiratory: Negative.   Cardiovascular: Negative.   Gastrointestinal: Negative.   Genitourinary: Negative.   Musculoskeletal: Negative.   Skin: Negative.   Neurological: Negative.   Endo/Heme/Allergies: Negative.   Psychiatric/Behavioral: Positive for depression (Stabilized with medication prior to discharge) and substance abuse (Alcoholism, chronic). Negative for suicidal ideas, hallucinations and memory loss. The patient has insomnia (Stabilized with medication prior to discharge). The patient is not nervous/anxious.     Blood pressure 103/70, pulse 67, temperature 97.9 F (36.6 C), temperature source Oral, resp. rate 20, height _0  (1.651 m), weight 65.998 kg (145 lb 8 oz), last menstrual period 09/24/2013, SpO2 98.00%.Body mass index is 24.21 kg/(m^2).   General Appearance: Well Groomed   Engineer, water:: Good   Speech: Normal Rate   Volume: Normal   Mood: Euthymic   Affect: Appropriate   Thought Process: Goal Directed and Linear   Orientation: Full (Time, Place, and Person)   Thought Content: no hallucinations, no delusions   Suicidal Thoughts:  No   Homicidal Thoughts: No   Memory: recent and remote grossly intact   Judgement: Good   Insight: Fair   Psychomotor Activity: Normal   Concentration: Good   Recall: Good   Fund of Knowledge:Good   Language: Good   Akathisia: Negative   Handed: Right   AIMS (if indicated):   Assets: Communication Skills  Desire for Improvement  Physical Health  Resilience   Sleep: Number of Hours: 6    Past Psychiatric History: Diagnosis: Alcohol dependence  Hospitalizations: [facility] adult unit  Outpatient Care:  [facility]  Substance Abuse Care: [facility]  Self-Mutilation: NA  Suicidal Attempts: NA  Violent Behaviors: NA   Musculoskeletal: Strength & Muscle Tone: within normal limits Gait & Station: normal Patient leans: N/A  DSM5: Schizophrenia Disorders:  NA Obsessive-Compulsive Disorders:  NA Trauma-Stressor Disorders:  NA Substance/Addictive Disorders:  Alcohol Related Disorder - Severe (303.90) Depressive Disorders:  NA  Axis Diagnosis:  AXIS I:  Alcohol dependence AXIS II:  Deferred AXIS III:   Past Medical History  Diagnosis Date  . ANXIETY 05/20/2009  . DEPRESSION/ANXIETY 03/24/2008  . DEPRESSION 05/20/2009  . INSOMNIA-SLEEP DISORDER-UNSPEC 03/24/2008  . PSORIASIS 03/24/2008  . SKIN RASH 08/13/2009  . Seizure    AXIS IV:  other psychosocial or environmental problems and Alcoholism, Chronic AXIS V:  63  Level of Care:  OP  Hospital Course:  Katelyn Bridges is 33 years old female, she reports that she has been drinking daily and heavily , particularly over recent weeks, up to one bottle of wine per day. She states she has been feeling depressed and in  the context of speaking with her roommate, told her she had thoughts of wanting to die and hoping she would not wake up. As such, roommate Informed mother, who in turn initiated commitment paperwork.  Although Katelyn Bridges reported on admission as having been drinking heavily over recent  weeks, her toxicology tests result on admission was less than 11. However, her UDS test reports showed positive Amphetamine and THC. Katelyn Bridges indicated during admission assessment that she has been feeling depressed and the thoughts of wanting to die. She required mood stabilization treatment. To combat any substance withdrawal symptoms that she may experience, She was ordered and received Librium 25 mg capsules on a prn basis. She was also medicated and discharged on; Prozac 20 mg daily for depression, Hydroxyzine 25 mg three times daily as needed for anxiety and Prozac 50 mg Q bedtime for sleep. She also was medicated and discharged on Cipro 500 mg bid for urinary tract infection symptoms. She tolerated her treatment regimen without any significant adverse effects and reactions. Katelyn Bridges was also enrolled and participated in the group counseling sessions being offered and held on this unit. She learned coping skills.  Katelyn Bridges's mood is stabilized. She is not presenting with any substance withdrawal symptoms and or suicidal ideations. She is currently being discharged to continue psychiatric treatment at the [facility]. She is provided with all the pertinent information required to make this appointment without problems. Upon discharge, she adamantly denies any SIHI, AVH, delusional thoughts, paranoia and or substance withdrawal symptoms. She received from the [facility], a 14 days worth, supply samples of her [facility] discharge medications. She left [facility] with all personal; belongings in no apparent distress. Transportation per friend.  Consults:  psychiatry  Significant Diagnostic Studies:  labs: CBC with diff, CMP, UDS, Toxicology tests, U/A, results reviewed, stable  Discharge Vitals:   Blood pressure 103/70, pulse 67, temperature 97.9 F (36.6 C), temperature source Oral, resp. rate 20, height _0  (1.651 m), weight 65.998 kg (145 lb 8 oz), last menstrual period 09/24/2013, SpO2  98.00%. Body mass index is 24.21 kg/(m^2). Lab Results:   Results for orders placed during the hospital encounter of 09/30/13 (from the past 72 hour(s))  ETHANOL     Status: None   Collection Time    09/30/13 11:44 AM      Result Value Ref Range   Alcohol, Ethyl (B) <11  0 - 11 mg/dL   Comment:            LOWEST DETECTABLE LIMIT FOR     SERUM ALCOHOL IS 11 mg/dL     FOR MEDICAL PURPOSES ONLY  ACETAMINOPHEN LEVEL     Status: None   Collection Time    09/30/13  1:10 PM      Result Value Ref Range   Acetaminophen (Tylenol), Serum <15.0  10 - 30 ug/mL   Comment:            THERAPEUTIC CONCENTRATIONS VARY     SIGNIFICANTLY. A RANGE OF 10-30     ug/mL MAY BE AN EFFECTIVE     CONCENTRATION FOR MANY PATIENTS.     HOWEVER, SOME ARE BEST TREATED     AT CONCENTRATIONS OUTSIDE THIS     RANGE.     ACETAMINOPHEN CONCENTRATIONS     >150 ug/mL AT 4 HOURS AFTER     INGESTION AND >50 ug/mL AT 12     HOURS AFTER INGESTION ARE     OFTEN ASSOCIATED WITH TOXIC  REACTIONS.  CBC     Status: Abnormal   Collection Time    09/30/13  1:10 PM      Result Value Ref Range   WBC 10.8 (*) 4.0 - 10.5 K/uL   RBC 4.64  3.87 - 5.11 MIL/uL   Hemoglobin 14.8  12.0 - 15.0 g/dL   HCT 43.4  36.0 - 46.0 %   MCV 93.5  78.0 - 100.0 fL   MCH 31.9  26.0 - 34.0 pg   MCHC 34.1  30.0 - 36.0 g/dL   RDW 13.3  11.5 - 15.5 %   Platelets 281  150 - 400 K/uL  COMPREHENSIVE METABOLIC PANEL     Status: Abnormal   Collection Time    09/30/13  1:10 PM      Result Value Ref Range   Sodium 139  137 - 147 mEq/L   Potassium 4.4  3.7 - 5.3 mEq/L   Chloride 100  96 - 112 mEq/L   CO2 26  19 - 32 mEq/L   Glucose, Bld 98  70 - 99 mg/dL   BUN 8  6 - 23 mg/dL   Creatinine, Ser 0.58  0.50 - 1.10 mg/dL   Calcium 9.2  8.4 - 10.5 mg/dL   Total Protein 7.5  6.0 - 8.3 g/dL   Albumin 3.9  3.5 - 5.2 g/dL   AST 43 (*) 0 - 37 U/L   ALT 40 (*) 0 - 35 U/L   Alkaline Phosphatase 87  39 - 117 U/L   Total Bilirubin 0.6  0.3 - 1.2 mg/dL    GFR calc non Af Amer >90  >90 mL/min   GFR calc Af Amer >90  >90 mL/min   Comment: (NOTE)     The eGFR has been calculated using the CKD EPI equation.     This calculation has not been validated in all clinical situations.     eGFR's persistently <90 mL/min signify possible Chronic Kidney     Disease.   Anion gap 13  5 - 15  SALICYLATE LEVEL     Status: Abnormal   Collection Time    09/30/13  1:10 PM      Result Value Ref Range   Salicylate Lvl <9.6 (*) 2.8 - 20.0 mg/dL    Physical Findings: AIMS: Facial and Oral Movements Muscles of Facial Expression: None, normal Lips and Perioral Area: None, normal Jaw: None, normal Tongue: None, normal,Extremity Movements Upper (arms, wrists, hands, fingers): None, normal Lower (legs, knees, ankles, toes): None, normal, Trunk Movements Neck, shoulders, hips: None, normal, Overall Severity Severity of abnormal movements (highest score from questions above): None, normal Incapacitation due to abnormal movements: None, normal Patient's awareness of abnormal movements (rate only patient's report): No Awareness, Dental Status Current problems with teeth and/or dentures?: No Does patient usually wear dentures?: No  CIWA:    COWS:     Psychiatric Specialty Exam: See Psychiatric Specialty Exam and Suicide Risk Assessment completed by Attending Physician prior to discharge.  Discharge destination:  Home  Is patient on multiple antipsychotic therapies at discharge:  No   Has Patient had three or more failed trials of antipsychotic monotherapy by history:  No  Recommended Plan for Multiple Antipsychotic Therapies: NA    Medication List       Indication   ciprofloxacin 500 MG tablet  Commonly known as:  CIPRO  Take 1 tablet (500 mg total) by mouth 2 (two) times daily. For urinary tract infection   Indication:  UTI     FLUoxetine 20 MG capsule  Commonly known as:  PROZAC  Take 1 capsule (20 mg total) by mouth daily. For depression    Indication:  Major Depressive Disorder     hydrOXYzine 25 MG tablet  Commonly known as:  ATARAX/VISTARIL  Take 1 tablet (25 mg) three times daily as needed: For anxiety   Indication:  Tension, Anxiety     traZODone 50 MG tablet  Commonly known as:  DESYREL  Take 1 tablet (50 mg total) by mouth at bedtime as needed for sleep (Insomnia).   Indication:  Trouble Sleeping       Follow-up Information   Follow up with [facility]. (Please present for intake assessment for therapy and medication management services between Monday - Friday 8 am- 12 pm or 1 pm - 2:30 pm. Please call office if you have any questions.)    Specialty:  Professional Counselor   Contact information:   [facility] [address] [phone]      Follow-up recommendations:  Activity:  As tolerated Diet: As recommended by your primary care doctor. Keep all scheduled follow-up appointments as recommended.  Comments: Take all your medications as prescribed by your mental healthcare provider. Report any adverse effects and or reactions from your medicines to your outpatient provider promptly. Patient is instructed and cautioned to not engage in alcohol and or illegal drug use while on prescription medicines. In the event of worsening symptoms, patient is instructed to call the crisis hotline, 911 and or go to the nearest ED for appropriate evaluation and treatment of symptoms. Follow-up with your primary care provider for your other medical issues, concerns and or health care needs.   Total Discharge Time:  Greater than 30 minutes.  SignedEncarnacion Slates, PMHNP-BC 10/03/2013, 10:29 AM  Patient seen, Suicide Assessment Completed.  Disposition Plan Reviewed

## 2013-10-08 NOTE — Progress Notes (Signed)
Patient Discharge Instructions:  After Visit Summary (AVS):   Faxed to:  10/08/13 Discharge Summary Note:   Faxed to:  10/08/13 Psychiatric Admission Assessment Note:   Faxed to:  10/08/13 Suicide Risk Assessment - Discharge Assessment:   Faxed to:  10/08/13 Faxed/Sent to the Next Level Care provider:  10/08/13 [facility] @ [phone]  Jerelene ReddenSheena E [place], 10/08/2013, 2:06 PM

## 2013-11-24 IMAGING — CT CT ABD-PELV W/ CM
1 of 2 series · 15 of 32 positions shown, 19 images · IV contrast (APPLIED)
Comparison: CT of the abdomen and pelvis 03/09/2007.

CLINICAL DATA: Pain, fever and chills.  Nausea.

CT ABDOMEN AND PELVIS WITH CONTRAST
TECHNIQUE: Multidetector CT imaging of the abdomen and pelvis was
performed following the standard protocol during bolus
administration of intravenous contrast.
Contrast: 1 OMNIPAQUE IOHEXOL 300 MG/ML IV SOLN, 100mL OMNIPAQUE
IOHEXOL 300 MG/ML IV SOLN

[Series 2: abd/pelv with 5.0 b31f st · axial · 0.59mm/px · z∈[-500,-120]mm · 15 of 84 slices shown, 19 images]
[im 4/84  soft-tissue]
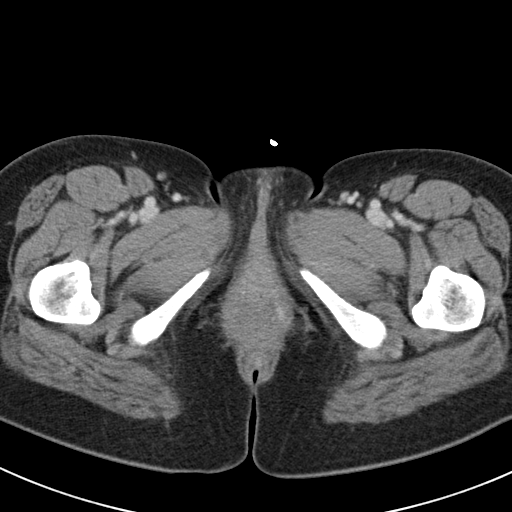
[im 4/84  bone]
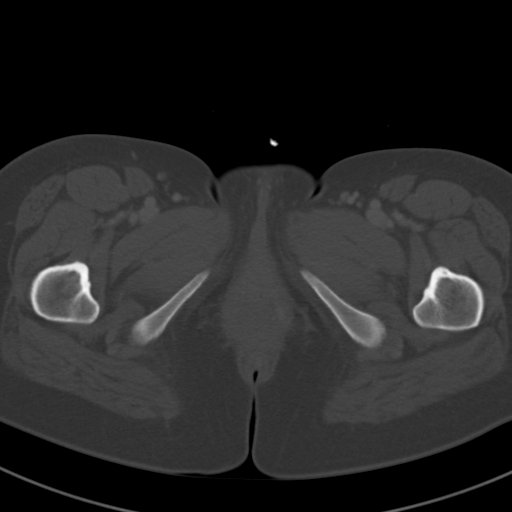
[im 10/84  soft-tissue]
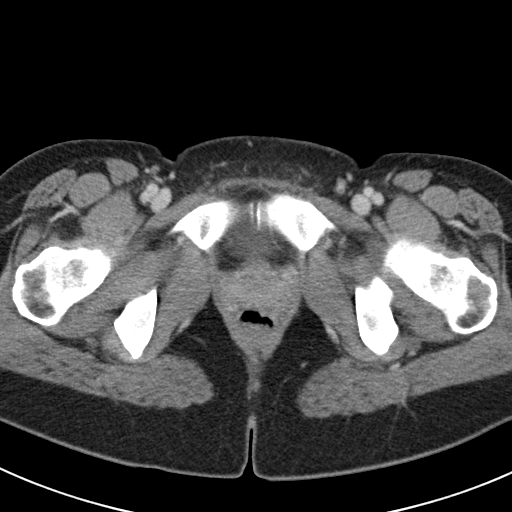
[im 17/84  soft-tissue]
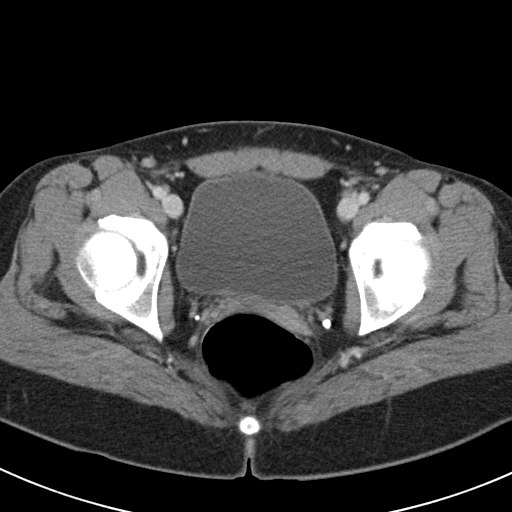
[im 24/84  soft-tissue]
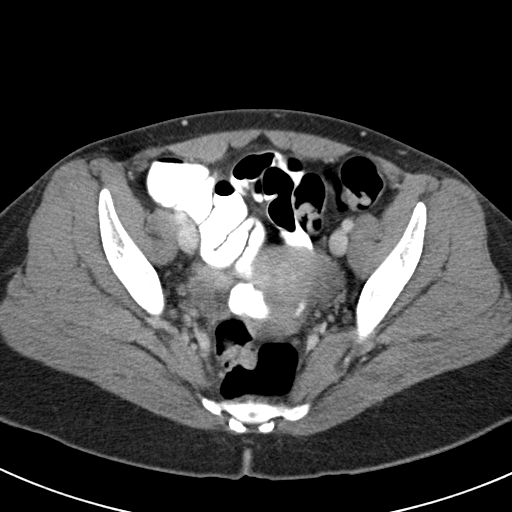
[im 30/84  soft-tissue]
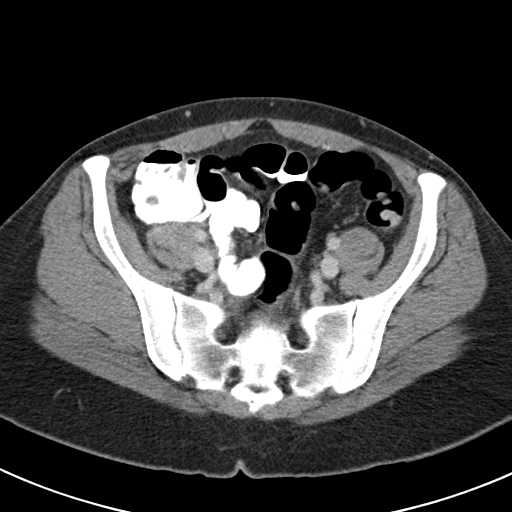
[im 37/84  soft-tissue]
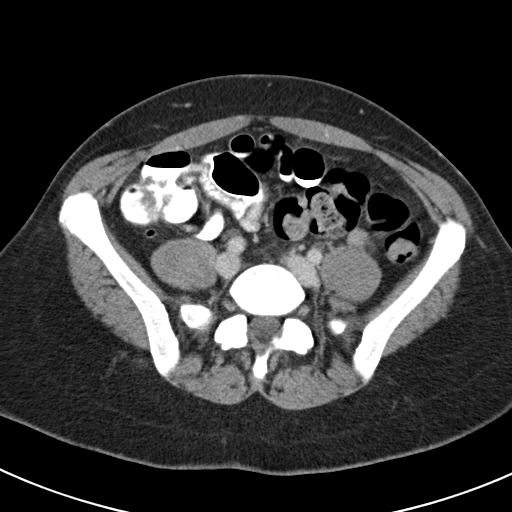
[im 44/84  soft-tissue]
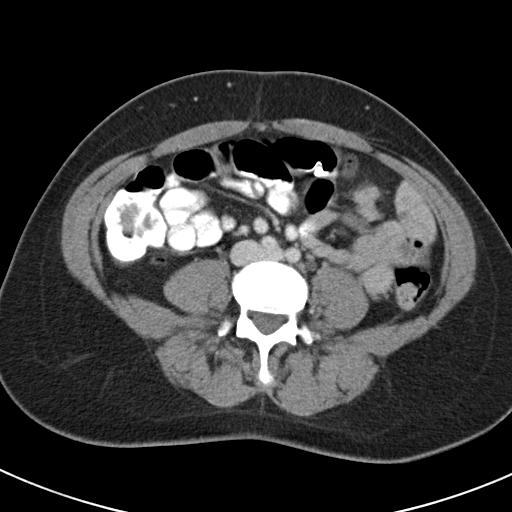
[im 47/84  soft-tissue]
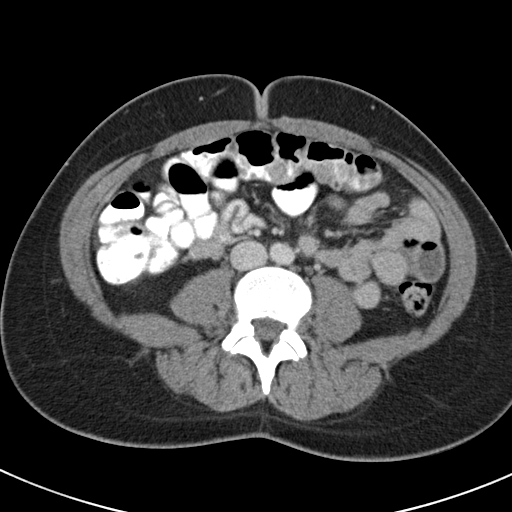
[im 54/84  soft-tissue]
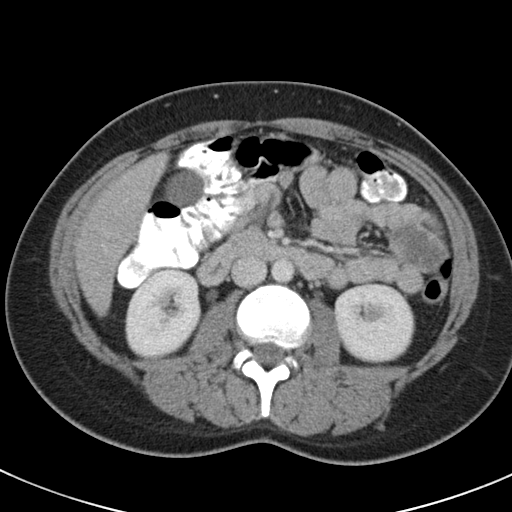
[im 54/84  bone]
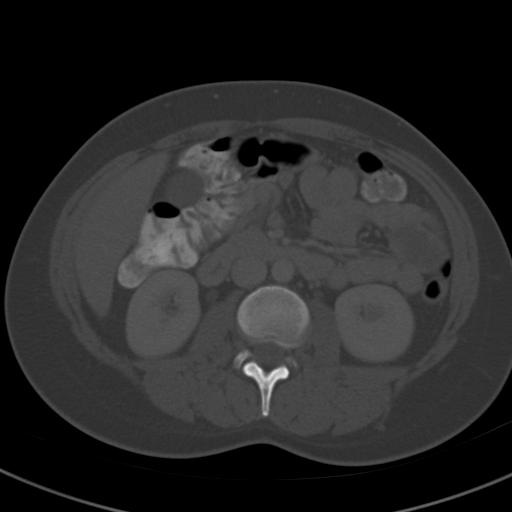
[im 60/84  soft-tissue]
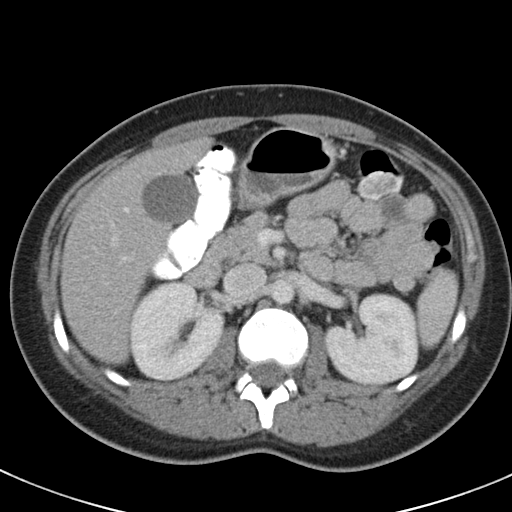
[im 67/84  soft-tissue]
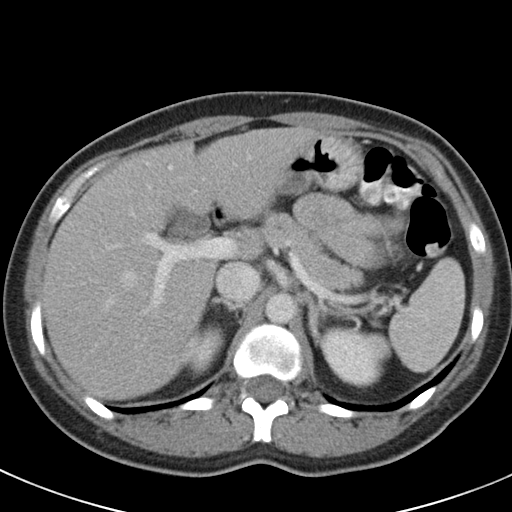
[im 70/84  lung]
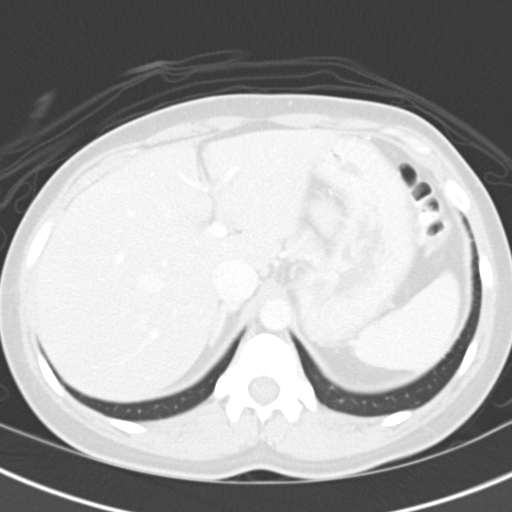
[im 74/84  soft-tissue]
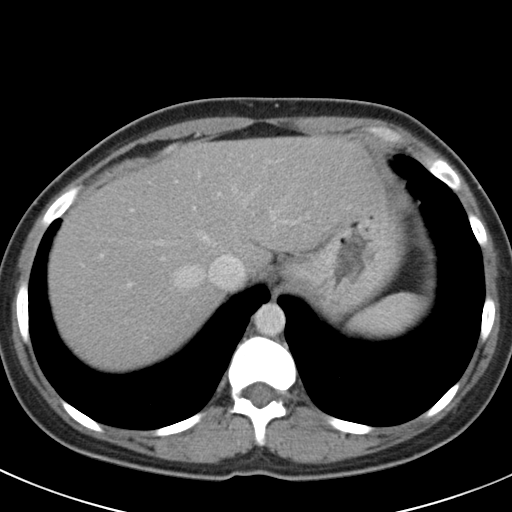
[im 74/84  lung]
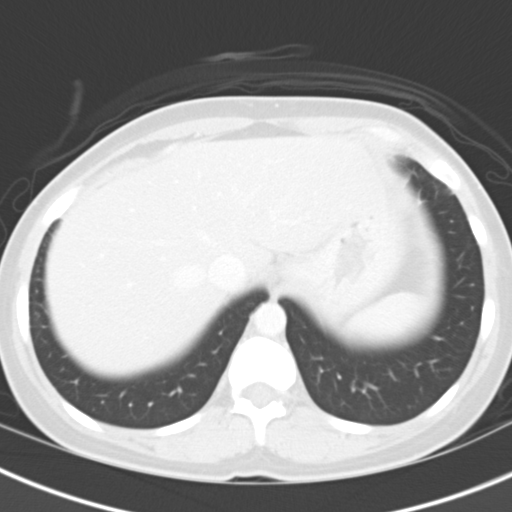
[im 77/84  lung]
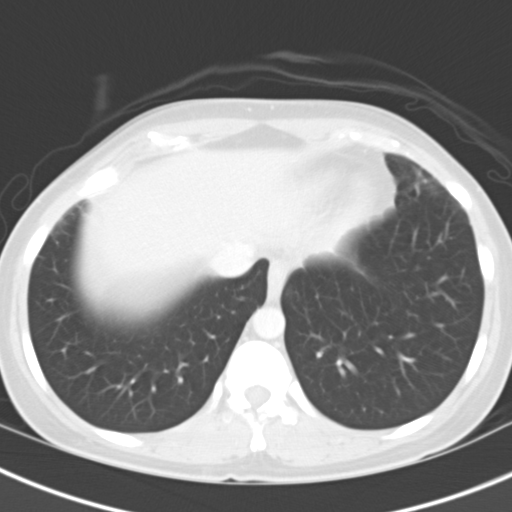
[im 80/84  soft-tissue]
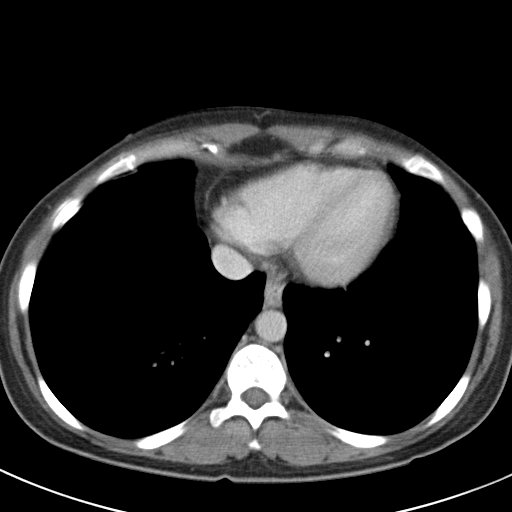
[im 80/84  lung]
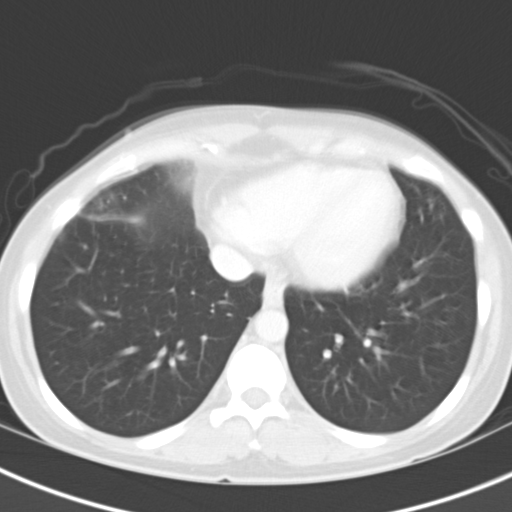

[15 of 32 positions shown; findings below may reference images not displayed]

FINDINGS: Lung Bases: Unremarkable.

Abdomen/Pelvis:  The infused appearance of the liver, gallbladder,
pancreas, spleen, bilateral adrenal glands and bilateral kidneys is
unremarkable.  Appendix is well visualized and is normal in
appearance.  There is no ascites or pneumoperitoneum and no
pathologic distension of bowel.  No definite pathologic adenopathy
noted within the abdomen or pelvis.  Uterus, ovaries and urinary
bladder are unremarkable in appearance.

Musculoskeletal: There are no aggressive appearing lytic or blastic
lesions noted in the visualized portions of the skeleton.
IMPRESSION: 1.  No acute findings in the abdomen or pelvis to account for the
patient's symptoms.  Specifically, the appendix is well visualized
and is normal in appearance.

## 2013-12-17 ENCOUNTER — Emergency Department (HOSPITAL_COMMUNITY)
Admission: EM | Admit: 2013-12-17 | Discharge: 2013-12-18 | Disposition: A | Discharge status: Other Acute Inpatient Hospital | Payer: Federal, State, Local not specified - Other | Attending: Emergency Medicine | Admitting: Emergency Medicine

## 2013-12-17 ENCOUNTER — Encounter (HOSPITAL_COMMUNITY): Payer: Self-pay

## 2013-12-17 DIAGNOSIS — F332 Major depressive disorder, recurrent severe without psychotic features: Secondary | ICD-10-CM

## 2013-12-17 DIAGNOSIS — F1410 Cocaine abuse, uncomplicated: Secondary | ICD-10-CM | POA: Insufficient documentation

## 2013-12-17 DIAGNOSIS — Z792 Long term (current) use of antibiotics: Secondary | ICD-10-CM | POA: Insufficient documentation

## 2013-12-17 DIAGNOSIS — G4700 Insomnia, unspecified: Secondary | ICD-10-CM | POA: Insufficient documentation

## 2013-12-17 DIAGNOSIS — F419 Anxiety disorder, unspecified: Secondary | ICD-10-CM | POA: Insufficient documentation

## 2013-12-17 DIAGNOSIS — Z3202 Encounter for pregnancy test, result negative: Secondary | ICD-10-CM | POA: Insufficient documentation

## 2013-12-17 DIAGNOSIS — Z720 Tobacco use: Secondary | ICD-10-CM | POA: Insufficient documentation

## 2013-12-17 DIAGNOSIS — Z79899 Other long term (current) drug therapy: Secondary | ICD-10-CM | POA: Insufficient documentation

## 2013-12-17 DIAGNOSIS — F1020 Alcohol dependence, uncomplicated: Secondary | ICD-10-CM

## 2013-12-17 DIAGNOSIS — Z8669 Personal history of other diseases of the nervous system and sense organs: Secondary | ICD-10-CM | POA: Insufficient documentation

## 2013-12-17 DIAGNOSIS — R109 Unspecified abdominal pain: Secondary | ICD-10-CM | POA: Insufficient documentation

## 2013-12-17 DIAGNOSIS — F329 Major depressive disorder, single episode, unspecified: Secondary | ICD-10-CM

## 2013-12-17 DIAGNOSIS — F32A Depression, unspecified: Secondary | ICD-10-CM

## 2013-12-17 DIAGNOSIS — Z872 Personal history of diseases of the skin and subcutaneous tissue: Secondary | ICD-10-CM | POA: Insufficient documentation

## 2013-12-17 DIAGNOSIS — F1210 Cannabis abuse, uncomplicated: Secondary | ICD-10-CM | POA: Insufficient documentation

## 2013-12-17 DIAGNOSIS — F1310 Sedative, hypnotic or anxiolytic abuse, uncomplicated: Secondary | ICD-10-CM | POA: Insufficient documentation

## 2013-12-17 NOTE — ED Notes (Signed)
Pt states that she's not suicidal and has never to plan to take any pills, pt states that she was out with friends ans having a great time.
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
## 2013-12-17 NOTE — ED Provider Notes (Signed)
CSN: [id number]     Arrival date & time 12/17/13  2312 History  This chart was scribed for Katelyn MaduraKelly Kimyatta Lecy, PA-C working with [name], MD by [name], ED Scribe. This patient was seen in room WTR3/WLPT3 and the patient's care was started at 11:46 PM.    Chief Complaint  Patient presents with  . Suicidal   The history is provided by the patient. No language interpreter was used.   HPI Comments: Katelyn HippoJennifer Brooke Bridges is a 33 y.o. female who presents to the Emergency Department IVC'd by her mother. Pt states that she is depressed all the time but states she is not suicidal. Patient states she has an appointment to meet with a psychiatrist in 2 days. Patient states that she is 1.5 months late for her menstrual cycle. Pt states she hasn't had sex in 3 months. She states that her abdomen is distended for the past 2 weeks and she has intermittently experiencing stabbing, twisting pains. Pt states that she was IVC'd due to her mom being "slightly crazy". Pt states that she does smoke marijuana but denies any recent IV drug use. Pt states she has recently drank alcohol. Pt states that she has been dealing with a lot of things recently such as her dog dying and her fiance breaking up with her. Pt states that she does not have a great relationship with her mother. Pt states that when ever she drinks her mom tries to get her committed.  Past Medical History  Diagnosis Date  . ANXIETY 05/20/2009  . DEPRESSION/ANXIETY 03/24/2008  . DEPRESSION 05/20/2009  . INSOMNIA-SLEEP DISORDER-UNSPEC 03/24/2008  . PSORIASIS 03/24/2008  . SKIN RASH 08/13/2009  . Seizure    Past Surgical History  Procedure Laterality Date  . S/p uterine polyp  2010  . Ovarian cyst removal     Family History  Problem Relation Age of Onset  . Diabetes Mother   . Anxiety disorder Mother   . Depression Father   . Diabetes Other    History  Substance Use Topics  . Smoking status: Current Every Day Smoker -- 1.00 packs/day     Types: Cigarettes  . Smokeless tobacco: Not on file  . Alcohol Use: 1.8 oz/week    2 Cans of beer, 1 Shots of liquor per week     Comment: daily drinker   OB History    No data available      Review of Systems  Psychiatric/Behavioral: Positive for dysphoric mood. Negative for suicidal ideas.  All other systems reviewed and are negative.   Allergies  Review of patient's allergies indicates no known allergies.  Home Medications   Prior to Admission medications   Medication Sig Start Date End Date Taking? Authorizing Provider  ciprofloxacin (CIPRO) 500 MG tablet Take 1 tablet (500 mg total) by mouth 2 (two) times daily. For urinary tract infection Patient not taking: Reported on 12/18/2013 10/03/13   Sanjuana KavaAgnes I Nwoko, NP  FLUoxetine (PROZAC) 20 MG capsule Take 1 capsule (20 mg total) by mouth daily. For depression Patient not taking: Reported on 12/18/2013 10/03/13   Sanjuana KavaAgnes I Nwoko, NP  hydrOXYzine (ATARAX/VISTARIL) 25 MG tablet Take 1 tablet (25 mg) three times daily as needed: For anxiety Patient not taking: Reported on 12/18/2013 10/03/13   Sanjuana KavaAgnes I Nwoko, NP  traZODone (DESYREL) 50 MG tablet Take 1 tablet (50 mg total) by mouth at bedtime as needed for sleep (Insomnia). Patient not taking: Reported on 12/18/2013 10/03/13   Sanjuana KavaAgnes I Nwoko, NP  Triage Vitals: BP 123/99 mmHg  Pulse 99  Temp(Src) 98.8 F (37.1 C) (Oral)  Resp 20  Ht 5\' 4"  (1.626 m)  Wt 150 lb (68.04 kg)  BMI 25.73 kg/m2  SpO2 98%  LMP 10/30/2013  Physical Exam  Constitutional: She is oriented to person, place, and time. She appears well-developed and well-nourished. No distress.  Nontoxic/nonseptic appearing  HENT:  Head: Normocephalic and atraumatic.  Eyes: Conjunctivae and EOM are normal. No scleral icterus.  Neck: Normal range of motion.  Cardiovascular: Normal rate, regular rhythm and intact distal pulses.   Pulmonary/Chest: Effort normal. No respiratory distress.  Abdominal: Soft. She exhibits no mass. There  is no rebound.  Mild TTP in suprapubic region without masses or peritoneal signs.  Musculoskeletal: Normal range of motion.  Neurological: She is alert and oriented to person, place, and time. She exhibits normal muscle tone. Coordination normal.  Skin: Skin is warm and dry. No rash noted. She is not diaphoretic. No erythema. No pallor.  Psychiatric: She has a normal mood and affect. Her behavior is normal.  Nursing note and vitals reviewed.   ED Course  Procedures (including critical care time) DIAGNOSTIC STUDIES: Oxygen Saturation is 98% on RA, normal by my interpretation.    COORDINATION OF CARE: 12:03 AM-Discussed treatment plan with pt at bedside and pt agreed to plan.   Labs Review Labs Reviewed  CBC - Abnormal; Notable for the following:    WBC 11.7 (*)    All other components within normal limits  COMPREHENSIVE METABOLIC PANEL - Abnormal; Notable for the following:    Potassium 3.6 (*)    Total Bilirubin 0.2 (*)    Anion gap 17 (*)    All other components within normal limits  ETHANOL - Abnormal; Notable for the following:    Alcohol, Ethyl (B) 243 (*)    All other components within normal limits  SALICYLATE LEVEL - Abnormal; Notable for the following:    Salicylate Lvl <2.0 (*)    All other components within normal limits  ACETAMINOPHEN LEVEL  HCG, QUANTITATIVE, PREGNANCY  URINE RAPID DRUG SCREEN (HOSP PERFORMED)    Imaging Review No results found.   EKG Interpretation None      MDM   Final diagnoses:  Depression    33 year old female presents to the emergency department for further psychiatric evaluation. Patient with IVC papers taken out by mother. Papers state that patient has threatened suicide. Patient does admit to depression, but denies being suicidal. She does state that she was drinking alcohol prior to arrival today and denies drinking as a way of self-medicating/managing her depression. Patient with secondary complaint of suprapubic abdominal  discomfort which is intermittent and "stabbing and twisting". Patient denies associated fever and has very mild tenderness with deep palpation to her suprapubic region today. No masses, peritoneal signs, or guarding. Patient has a negative blood pregnancy today. No significant leukocytosis or left shift to suggest infectious etiology. Liver and kidney function are preserved. Given chronicity of symptoms I have a low suspicion for emergent process as cause of pain today. I have recommended that the patient follow-up with an OB/GYN for further evaluation of her abdominal pain and late menstrual cycle.  Patient with a mildly elevated anion gap today. Suspect this is secondary to alcohol metabolization. Will obtain BMP to ensure no worsening of this. UDS is currently pending. TTS has attempted contact with patient, unsuccessfully. Patient signed out to oncoming ED midlevel who will follow up on UDS and repeat BMP. If  labs stable, anticipate that patient will able to be medically cleared. Disposition to be determined by oncoming provider WRT patient's psychiatric care.  I personally performed the services described in this documentation, which was scribed in my presence. The recorded information has been reviewed and is accurate.   Filed Vitals:   12/17/13 2321  BP: 123/99  Pulse: 99  Temp: 98.8 F (37.1 C)  TempSrc: Oral  Resp: 20  Height: 5\' 4"  (1.626 m)  Weight: 150 lb (68.04 kg)  SpO2: 98%       Katelyn Madura, PA-C 12/18/13 0539  Tomasita Crumble, MD 12/18/13 [phone]

## 2013-12-17 NOTE — ED Notes (Signed)
Pt is under IVC by her mother, that states she was going to take pills to end it all, pt is currently under outpatient care

## 2013-12-18 ENCOUNTER — Encounter (HOSPITAL_COMMUNITY): Payer: Self-pay | Admitting: Behavioral Health

## 2013-12-18 ENCOUNTER — Inpatient Hospital Stay (HOSPITAL_COMMUNITY)
Admission: AD | Admit: 2013-12-18 | Discharge: 2013-12-26 | DRG: 897 | Disposition: A | Discharge status: Home / Self Care | Payer: Federal, State, Local not specified - Other | Source: Intra-hospital | Attending: Psychiatry | Admitting: Psychiatry

## 2013-12-18 DIAGNOSIS — F1920 Other psychoactive substance dependence, uncomplicated: Secondary | ICD-10-CM

## 2013-12-18 DIAGNOSIS — F1410 Cocaine abuse, uncomplicated: Secondary | ICD-10-CM | POA: Diagnosis present

## 2013-12-18 DIAGNOSIS — F10239 Alcohol dependence with withdrawal, unspecified: Secondary | ICD-10-CM | POA: Diagnosis present

## 2013-12-18 DIAGNOSIS — F329 Major depressive disorder, single episode, unspecified: Secondary | ICD-10-CM | POA: Diagnosis present

## 2013-12-18 DIAGNOSIS — R45851 Suicidal ideations: Secondary | ICD-10-CM | POA: Diagnosis present

## 2013-12-18 DIAGNOSIS — F1020 Alcohol dependence, uncomplicated: Secondary | ICD-10-CM | POA: Insufficient documentation

## 2013-12-18 DIAGNOSIS — G4700 Insomnia, unspecified: Secondary | ICD-10-CM | POA: Diagnosis present

## 2013-12-18 DIAGNOSIS — F1099 Alcohol use, unspecified with unspecified alcohol-induced disorder: Secondary | ICD-10-CM

## 2013-12-18 DIAGNOSIS — Z833 Family history of diabetes mellitus: Secondary | ICD-10-CM | POA: Diagnosis not present

## 2013-12-18 DIAGNOSIS — F17210 Nicotine dependence, cigarettes, uncomplicated: Secondary | ICD-10-CM | POA: Diagnosis present

## 2013-12-18 DIAGNOSIS — F411 Generalized anxiety disorder: Secondary | ICD-10-CM | POA: Diagnosis present

## 2013-12-18 DIAGNOSIS — F1490 Cocaine use, unspecified, uncomplicated: Secondary | ICD-10-CM

## 2013-12-18 DIAGNOSIS — F1120 Opioid dependence, uncomplicated: Secondary | ICD-10-CM | POA: Diagnosis present

## 2013-12-18 DIAGNOSIS — Y908 Blood alcohol level of 240 mg/100 ml or more: Secondary | ICD-10-CM | POA: Diagnosis present

## 2013-12-18 DIAGNOSIS — F1024 Alcohol dependence with alcohol-induced mood disorder: Secondary | ICD-10-CM | POA: Insufficient documentation

## 2013-12-18 DIAGNOSIS — F1210 Cannabis abuse, uncomplicated: Secondary | ICD-10-CM | POA: Diagnosis present

## 2013-12-18 DIAGNOSIS — F332 Major depressive disorder, recurrent severe without psychotic features: Secondary | ICD-10-CM | POA: Insufficient documentation

## 2013-12-18 DIAGNOSIS — F1290 Cannabis use, unspecified, uncomplicated: Secondary | ICD-10-CM

## 2013-12-18 DIAGNOSIS — Z23 Encounter for immunization: Secondary | ICD-10-CM | POA: Diagnosis not present

## 2013-12-18 LAB — COMPREHENSIVE METABOLIC PANEL
ALBUMIN: 4.1 g/dL (ref 3.5–5.2)
ALT: 34 U/L (ref 0–35)
AST: 37 U/L (ref 0–37)
Alkaline Phosphatase: 87 U/L (ref 39–117)
Anion gap: 17 — ABNORMAL HIGH (ref 5–15)
BILIRUBIN TOTAL: 0.2 mg/dL — AB (ref 0.3–1.2)
BUN: 7 mg/dL (ref 6–23)
CHLORIDE: 100 meq/L (ref 96–112)
CO2: 21 mEq/L (ref 19–32)
CREATININE: 0.56 mg/dL (ref 0.50–1.10)
Calcium: 9.1 mg/dL (ref 8.4–10.5)
GFR calc Af Amer: >90 mL/min (ref >90)
GFR calc non Af Amer: >90 mL/min (ref >90)
Glucose, Bld: 79 mg/dL (ref 70–99)
Potassium: 3.6 mEq/L — ABNORMAL LOW (ref 3.7–5.3)
Sodium: 138 mEq/L (ref 137–147)
Total Protein: 8.2 g/dL (ref 6.0–8.3)

## 2013-12-18 LAB — BASIC METABOLIC PANEL
ANION GAP: 15 (ref 5–15)
BUN: 8 mg/dL (ref 6–23)
CO2: 25 meq/L (ref 19–32)
Calcium: 8.7 mg/dL (ref 8.4–10.5)
Chloride: 100 mEq/L (ref 96–112)
Creatinine, Ser: 0.59 mg/dL (ref 0.50–1.10)
GFR calc Af Amer: >90 mL/min (ref >90)
GFR calc non Af Amer: >90 mL/min (ref >90)
GLUCOSE: 90 mg/dL (ref 70–99)
POTASSIUM: 4.1 meq/L (ref 3.7–5.3)
SODIUM: 140 meq/L (ref 137–147)

## 2013-12-18 LAB — CBC
HCT: 43.9 % (ref 36.0–46.0)
Hemoglobin: 14.4 g/dL (ref 12.0–15.0)
MCH: 31.4 pg (ref 26.0–34.0)
MCHC: 32.8 g/dL (ref 30.0–36.0)
MCV: 95.6 fL (ref 78.0–100.0)
PLATELETS: 337 10*3/uL (ref 150–400)
RBC: 4.59 MIL/uL (ref 3.87–5.11)
RDW: 12.9 % (ref 11.5–15.5)
WBC: 11.7 10*3/uL — ABNORMAL HIGH (ref 4.0–10.5)

## 2013-12-18 LAB — RAPID URINE DRUG SCREEN, HOSP PERFORMED
Amphetamines: NOT DETECTED
BARBITURATES: NOT DETECTED
BENZODIAZEPINES: POSITIVE — AB
COCAINE: POSITIVE — AB
Opiates: NOT DETECTED
Tetrahydrocannabinol: POSITIVE — AB

## 2013-12-18 LAB — ACETAMINOPHEN LEVEL: Acetaminophen (Tylenol), Serum: <15 ug/mL (ref 10–30)

## 2013-12-18 LAB — ETHANOL: Alcohol, Ethyl (B): 243 mg/dL — ABNORMAL HIGH (ref 0–11)

## 2013-12-18 LAB — HCG, QUANTITATIVE, PREGNANCY: hCG, Beta Chain, Quant, S: <1 m[IU]/mL (ref <5)

## 2013-12-18 LAB — SALICYLATE LEVEL: Salicylate Lvl: <2 mg/dL — ABNORMAL LOW (ref 2.8–20.0)

## 2013-12-18 MED ORDER — VITAMIN B-1 100 MG PO TABS
100.0000 mg | ORAL_TABLET | Freq: Once | ORAL | Status: AC
  Administered 2013-12-18: 100 mg via ORAL
  Filled 2013-12-18: qty 1

## 2013-12-18 MED ORDER — CHLORDIAZEPOXIDE HCL 25 MG PO CAPS
25.0000 mg | ORAL_CAPSULE | Freq: Four times a day (QID) | ORAL | Status: AC
  Administered 2013-12-18 – 2013-12-19 (×5): 25 mg via ORAL
  Filled 2013-12-18 (×5): qty 1

## 2013-12-18 MED ORDER — CHLORDIAZEPOXIDE HCL 25 MG PO CAPS
25.0000 mg | ORAL_CAPSULE | Freq: Every day | ORAL | Status: AC
  Administered 2013-12-22: 25 mg via ORAL
  Filled 2013-12-18: qty 1

## 2013-12-18 MED ORDER — ONDANSETRON HCL 4 MG PO TABS: 4.0000 mg | ORAL_TABLET | Freq: Three times a day (TID) | ORAL | Status: DC | PRN

## 2013-12-18 MED ORDER — NICOTINE 21 MG/24HR TD PT24
21.0000 mg | MEDICATED_PATCH | Freq: Every day | TRANSDERMAL | Status: DC
  Administered 2013-12-19 – 2013-12-26 (×8): 21 mg via TRANSDERMAL
  Filled 2013-12-18 (×11): qty 1

## 2013-12-18 MED ORDER — IBUPROFEN 600 MG PO TABS: 600.0000 mg | ORAL_TABLET | Freq: Three times a day (TID) | ORAL | Status: DC | PRN

## 2013-12-18 MED ORDER — MAGNESIUM HYDROXIDE 400 MG/5ML PO SUSP
30.0000 mL | Freq: Every day | ORAL | Status: DC | PRN
  Administered 2013-12-20 – 2013-12-22 (×2): 30 mL via ORAL
  Filled 2013-12-18 (×2): qty 30

## 2013-12-18 MED ORDER — CHLORDIAZEPOXIDE HCL 25 MG PO CAPS: 25.0000 mg | ORAL_CAPSULE | Freq: Four times a day (QID) | ORAL | Status: DC | PRN

## 2013-12-18 MED ORDER — VITAMIN B-1 100 MG PO TABS: 100.0000 mg | ORAL_TABLET | Freq: Every day | ORAL | Status: DC

## 2013-12-18 MED ORDER — THIAMINE HCL 100 MG/ML IJ SOLN: 100.0000 mg | Freq: Once | INTRAMUSCULAR | Status: AC

## 2013-12-18 MED ORDER — TRAZODONE HCL 100 MG PO TABS
100.0000 mg | ORAL_TABLET | Freq: Every evening | ORAL | Status: DC | PRN
  Administered 2013-12-18 – 2013-12-20 (×3): 100 mg via ORAL
  Filled 2013-12-18 (×3): qty 1

## 2013-12-18 MED ORDER — ACETAMINOPHEN 325 MG PO TABS: 650.0000 mg | ORAL_TABLET | Freq: Four times a day (QID) | ORAL | Status: DC | PRN

## 2013-12-18 MED ORDER — THIAMINE HCL 100 MG/ML IJ SOLN: 100.0000 mg | Freq: Once | INTRAMUSCULAR | Status: DC

## 2013-12-18 MED ORDER — ALUM & MAG HYDROXIDE-SIMETH 200-200-20 MG/5ML PO SUSP: 30.0000 mL | ORAL | Status: DC | PRN

## 2013-12-18 MED ORDER — FLUOXETINE HCL 20 MG PO CAPS
20.0000 mg | ORAL_CAPSULE | Freq: Every day | ORAL | Status: DC
  Administered 2013-12-18: 20 mg via ORAL
  Filled 2013-12-18: qty 1

## 2013-12-18 MED ORDER — ADULT MULTIVITAMIN W/MINERALS CH
1.0000 | ORAL_TABLET | Freq: Every day | ORAL | Status: DC
  Administered 2013-12-18: 1 via ORAL
  Filled 2013-12-18: qty 1

## 2013-12-18 MED ORDER — ADULT MULTIVITAMIN W/MINERALS CH
1.0000 | ORAL_TABLET | Freq: Every day | ORAL | Status: DC
  Administered 2013-12-19 – 2013-12-26 (×8): 1 via ORAL
  Filled 2013-12-18 (×11): qty 1

## 2013-12-18 MED ORDER — LOPERAMIDE HCL 2 MG PO CAPS: 2.0000 mg | ORAL_CAPSULE | ORAL | Status: AC | PRN

## 2013-12-18 MED ORDER — CHLORDIAZEPOXIDE HCL 25 MG PO CAPS
25.0000 mg | ORAL_CAPSULE | Freq: Four times a day (QID) | ORAL | Status: AC | PRN
  Administered 2013-12-20 – 2013-12-21 (×3): 25 mg via ORAL
  Filled 2013-12-18 (×3): qty 1

## 2013-12-18 MED ORDER — CHLORDIAZEPOXIDE HCL 25 MG PO CAPS
25.0000 mg | ORAL_CAPSULE | ORAL | Status: AC
  Administered 2013-12-21 (×2): 25 mg via ORAL
  Filled 2013-12-18 (×2): qty 1

## 2013-12-18 MED ORDER — ACETAMINOPHEN 325 MG PO TABS
650.0000 mg | ORAL_TABLET | ORAL | Status: DC | PRN
  Administered 2013-12-18: 650 mg via ORAL
  Filled 2013-12-18: qty 2

## 2013-12-18 MED ORDER — HYDROXYZINE HCL 25 MG PO TABS: 25.0000 mg | ORAL_TABLET | Freq: Four times a day (QID) | ORAL | Status: DC | PRN

## 2013-12-18 MED ORDER — NICOTINE 21 MG/24HR TD PT24: 21.0000 mg | MEDICATED_PATCH | Freq: Every day | TRANSDERMAL | Status: DC

## 2013-12-18 MED ORDER — TRAZODONE HCL 100 MG PO TABS: 100.0000 mg | ORAL_TABLET | Freq: Every evening | ORAL | Status: DC | PRN

## 2013-12-18 MED ORDER — ZOLPIDEM TARTRATE 5 MG PO TABS: 5.0000 mg | ORAL_TABLET | Freq: Every evening | ORAL | Status: DC | PRN

## 2013-12-18 MED ORDER — CHLORDIAZEPOXIDE HCL 25 MG PO CAPS
25.0000 mg | ORAL_CAPSULE | Freq: Four times a day (QID) | ORAL | Status: DC
  Administered 2013-12-18: 25 mg via ORAL
  Filled 2013-12-18: qty 1

## 2013-12-18 MED ORDER — CARBAMAZEPINE 200 MG PO TABS
200.0000 mg | ORAL_TABLET | Freq: Two times a day (BID) | ORAL | Status: DC
  Administered 2013-12-18 – 2013-12-19 (×2): 200 mg via ORAL
  Filled 2013-12-18 (×6): qty 1

## 2013-12-18 MED ORDER — CHLORDIAZEPOXIDE HCL 25 MG PO CAPS: 25.0000 mg | ORAL_CAPSULE | ORAL | Status: DC

## 2013-12-18 MED ORDER — LORAZEPAM 1 MG PO TABS: 0.0000 mg | ORAL_TABLET | Freq: Four times a day (QID) | ORAL | Status: DC

## 2013-12-18 MED ORDER — CHLORDIAZEPOXIDE HCL 25 MG PO CAPS: 25.0000 mg | ORAL_CAPSULE | Freq: Every day | ORAL | Status: DC

## 2013-12-18 MED ORDER — FLUOXETINE HCL 20 MG PO CAPS
20.0000 mg | ORAL_CAPSULE | Freq: Every day | ORAL | Status: DC
  Administered 2013-12-19: 20 mg via ORAL
  Filled 2013-12-18 (×3): qty 1

## 2013-12-18 MED ORDER — VITAMIN B-1 100 MG PO TABS
100.0000 mg | ORAL_TABLET | Freq: Every day | ORAL | Status: DC
  Administered 2013-12-19 – 2013-12-26 (×8): 100 mg via ORAL
  Filled 2013-12-18 (×11): qty 1

## 2013-12-18 MED ORDER — LORAZEPAM 1 MG PO TABS: 0.0000 mg | ORAL_TABLET | Freq: Two times a day (BID) | ORAL | Status: DC

## 2013-12-18 MED ORDER — LOPERAMIDE HCL 2 MG PO CAPS: 2.0000 mg | ORAL_CAPSULE | ORAL | Status: DC | PRN

## 2013-12-18 MED ORDER — IBUPROFEN 200 MG PO TABS: 600.0000 mg | ORAL_TABLET | Freq: Three times a day (TID) | ORAL | Status: DC | PRN

## 2013-12-18 MED ORDER — CARBAMAZEPINE 200 MG PO TABS
200.0000 mg | ORAL_TABLET | Freq: Two times a day (BID) | ORAL | Status: DC
  Filled 2013-12-18 (×2): qty 1

## 2013-12-18 MED ORDER — CHLORDIAZEPOXIDE HCL 25 MG PO CAPS: 25.0000 mg | ORAL_CAPSULE | Freq: Three times a day (TID) | ORAL | Status: DC

## 2013-12-18 MED ORDER — CHLORDIAZEPOXIDE HCL 25 MG PO CAPS
25.0000 mg | ORAL_CAPSULE | Freq: Three times a day (TID) | ORAL | Status: AC
  Administered 2013-12-20 (×3): 25 mg via ORAL
  Filled 2013-12-18 (×3): qty 1
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
## 2013-12-18 NOTE — [facility] Counselor (Signed)
TTS Counselor spoke with Dr. Gwendolyn GrantWalden and informed him that she was going to do pt's assessment at this time.   TTS Counselor asked for any additional info on pt.         Levan HurstAnna Andrew, [facility] Triage Specialist

## 2013-12-18 NOTE — ED Notes (Signed)
TTS counselor attempted to assess pt.  Pt refusing to answer questions.
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
## 2013-12-18 NOTE — ED Notes (Addendum)
IVC papers report pt SI with plan to take overdose of pills, pt denies at present, stating [facility]  Pt admits to marijuana abuse and alcohol abuse.  Denies feeling hopeless.  Denies SI in the past.  Pt calm & cooperative at present, pending assessment by TTS.  IVC papers taken out by mother.
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
## 2013-12-18 NOTE — BHH Counselor (Signed)
TTS Counselor spoke with pt's mother to gain additional info about why she took out IVC on her daughter last night. Pt's mom says pt is "spiraling out of control" and that she is concerned that she is using illegal drugs and drinking daily. Mother adds that pt is very depressed and reportedly says she wants to die often.           Cyndie MullAnna Lulamae Skorupski, [facility] Triage Specialist

## 2013-12-18 NOTE — ED Notes (Signed)
TTS counselor at bedside evaluating pt at present.

## 2013-12-18 NOTE — ED Notes (Signed)
Pt presently laying on stretcher with eyes closed, refusing to answer questions, will attempt to assess in am.  Will monitor for safety.  No distress noted, resp even and unlabored, skin color good.

## 2013-12-18 NOTE — BHH Counselor (Signed)
TTS Counselor attempted to conduct assessment with pt but pt was not responsive. Unsure if pt was asleep or just trying to avoid assessment, as she reportedly refused to answer questions for other staff members as well.   TTS counselor will attempt again later this morning.       Cyndie MullAnna Lind Ausley, [facility] Triage Specialist

## 2013-12-18 NOTE — BH Assessment (Signed)
B[facility] Assessment Progress Note  [facility] by Thedore MinsMojeed Akintayo, MD, pending bed availability.  Per Berneice Heinrichina Tate, RN, AC, pt assigned to Rm 300-2.  Pt signed Consent to Release Information.  This along with [facility].  Pt's nurse, Minerva Areolaric, has been notified.  He agrees to send original paperwork [facility] along with pt via [employer], and to call report to [phone].  Katelyn Canninghomas Eliot Popper, MA Triage Specialist 12/18/2013 @ 15:13
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
## 2013-12-18 NOTE — BHH Counselor (Addendum)
Disposition: Per Donell SievertSpencer Simon, PA, pt does not meet criteria for inpatient treatment. Spencer recommended that pt stay and be evaluated by psychiatry this AM and that EDP then uphold or rescind IVC.   TTS Counselor called [facility] and informed her of the disposition.     Cyndie MullAnna Casie Sturgeon, [facility] Triage Specialist

## 2013-12-19 DIAGNOSIS — F332 Major depressive disorder, recurrent severe without psychotic features: Secondary | ICD-10-CM

## 2013-12-19 DIAGNOSIS — F1099 Alcohol use, unspecified with unspecified alcohol-induced disorder: Secondary | ICD-10-CM

## 2013-12-19 DIAGNOSIS — F1290 Cannabis use, unspecified, uncomplicated: Secondary | ICD-10-CM

## 2013-12-19 DIAGNOSIS — F1024 Alcohol dependence with alcohol-induced mood disorder: Secondary | ICD-10-CM | POA: Insufficient documentation

## 2013-12-19 DIAGNOSIS — F1410 Cocaine abuse, uncomplicated: Secondary | ICD-10-CM

## 2013-12-19 LAB — HEPATIC FUNCTION PANEL
ALT: 29 U/L (ref 0–35)
AST: 30 U/L (ref 0–37)
Albumin: 3.2 g/dL — ABNORMAL LOW (ref 3.5–5.2)
Alkaline Phosphatase: 78 U/L (ref 39–117)
Bilirubin, Direct: <0.2 mg/dL (ref 0.0–0.3)
Total Bilirubin: <0.2 mg/dL — ABNORMAL LOW (ref 0.3–1.2)
Total Protein: 6.4 g/dL (ref 6.0–8.3)

## 2013-12-19 MED ORDER — QUETIAPINE FUMARATE 25 MG PO TABS
25.0000 mg | ORAL_TABLET | Freq: Three times a day (TID) | ORAL | Status: DC | PRN
  Administered 2013-12-19 – 2013-12-20 (×3): 25 mg via ORAL
  Filled 2013-12-19 (×3): qty 1

## 2013-12-19 MED ORDER — SERTRALINE HCL 50 MG PO TABS
50.0000 mg | ORAL_TABLET | Freq: Every day | ORAL | Status: DC
  Administered 2013-12-19 – 2013-12-24 (×6): 50 mg via ORAL
  Filled 2013-12-19 (×8): qty 1
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
## 2013-12-19 NOTE — H&P (Signed)
Psychiatric Admission Assessment Adult  Patient Identification:  Katelyn Bridges Date of Evaluation:  12/19/2013 Chief Complaint:  "My mother overreacted to some things that happened recently."  History of Present Illness::  Katelyn Bridges is a 33 y.o. single, Caucasian female who presents to [facility] via IVC. Pt's mother placed pt under IVC due to concern over her drug and alcohol abuse and depression. IVC papers state that pt made threats last night to take pills and "just end it all" in addiction to her excessive alcohol and drug use last night. Pt adamantly denies any SI/HI or A/VH. She presents with irritable mood, stating that she is angry that her mother got her placed in the hospital "with no proof at all" that she is suicidal. She admits to relapsing on cocaine and marijuana after going to a birthday party two nights ago. Her mother became upset over her drug use and called the police to take her to the ED. Patient disputes most of what was reported by mother during her psychiatric assessment today stating "My mother did it again. I have told people that. I was just at my best friend's house when the police came walking in. She said I was suicidal and drinking. That is just a lie. I have been drinking but not as much as I was. I did not go back to the bad relationship that I got out of. I have been depressed. I stopped taking Prozac because it caused me to have tremors. I have been off medications for about a month. The cocaine was just a one time thing. It was stupid. I can explain the dirty spoons in my purse. They had extra silverware at work and I took some home because my mother hardly has any silverware in her house. I do get up for days then get down. My longest period of sobriety has been thirty days. I did notice during that time that my mood was better." Pt reports depressive symptoms, including poor sleep, tearfulness and crying spells, and feelings of sadness and  irritability.  Elements: Worsening depression, alcohol abuse, in the context of significant psycho- social stressors. Associated Signs/Synptoms: Depression Symptoms:  depressed mood, anhedonia, feelings of worthlessness/guilt, recurrent thoughts of death, insomnia, loss of energy/fatigue, disturbed sleep, Increased appetite, Weight gain (Hypo) Manic Symptoms:  Describes some periods of increased energy and poor sleep but not lasting days at a time. Anxiety Symptoms: Occasional panic attacks Psychotic Symptoms:Does not endorse  PTSD Symptoms: Denies  Total Time spent with patient: 1 hour  Psychiatric Specialty Exam: Physical Exam  Constitutional:  Physical exam findings reviewed from the [facility] and I concur with documented findings with no noted exceptions.     Review of Systems  Constitutional: Negative for fever, chills, weight loss, malaise/fatigue and diaphoresis.  HENT: Negative for congestion, ear discharge, ear pain, hearing loss, nosebleeds, sore throat and tinnitus.   Eyes: Negative for blurred vision, double vision, photophobia, pain, discharge and redness.  Respiratory: Negative for cough, hemoptysis, sputum production, shortness of breath and stridor.   Cardiovascular: Negative for chest pain, palpitations, orthopnea, claudication, leg swelling and PND.  Gastrointestinal: Negative for heartburn, nausea, vomiting, abdominal pain, diarrhea, constipation, blood in stool and melena.  Genitourinary: Negative for dysuria, urgency, frequency and hematuria.  Musculoskeletal: Negative for myalgias, back pain, joint pain, falls and neck pain.  Skin: Negative for rash.  Neurological: Positive for tremors and headaches.  Endo/Heme/Allergies: Negative for environmental allergies and polydipsia. Does not bruise/bleed easily.  Psychiatric/Behavioral: Positive  for depression, suicidal ideas and substance abuse. The patient is nervous/anxious.     Blood pressure 114/73, pulse 81,  temperature 98.3 F (36.8 C), temperature source Oral, resp. rate 18, height 5' 2.5" (1.588 m), weight 66.679 kg (147 lb), last menstrual period 12/18/2013.Body mass index is 26.44 kg/(m^2).  General Appearance: Fairly Groomed  Engineer, water::  Good  Speech:  Normal Rate  Volume:  Normal  Mood:  mildly depressed, with a constricted, but reactive affect  Affect:  Constricted  Thought Process:  Goal Directed and Linear  Orientation:  Other:  fully alert and attentive  Thought Content:  no hallucinations, no delusions  Suicidal Thoughts:  No- at this time denies suicidal plan or intention and contracts for safety on the unit  Homicidal Thoughts:  No  Memory:  Immediate;   Good Recent;   Good Remote;   Good  Judgement:  Fair  Insight:  Fair  Psychomotor Activity:  Normal  Concentration:  Good  Recall:  Good  Fund of Knowledge:Good  Language: Good  Akathisia:  No  Handed:  Right  AIMS (if indicated):     Assets:  Communication Skills Desire for Improvement Leisure Time Physical Health Resilience  Sleep:      Musculoskeletal: Strength & Muscle Tone: within normal limits- does not present with restlessness or agitation, and does not exhibit significant tremulousness Gait & Station: normal Patient leans: N/A  Past Psychiatric History: Yes Diagnosis: History of Depression and history of Alcohol Dependence. Denies mania, denies OCD, denies PTSD. Some panic attacks but no agoraphobia described  Hospitalizations: Denies   Outpatient Care: Was referred to [facility] after last admission but did not regularly follow up   Substance Abuse Care: None recent  Self-Mutilation: Denies cutting or self injurious  behaviors  Suicidal Attempts: Denies any history of suicide attempts  Violent Behaviors: Denies   Past Medical History:   Denies except for psoriasis.  SMokes 1 PPD. NKDA. Past Medical History  Diagnosis Date  . ANXIETY 05/20/2009  . DEPRESSION/ANXIETY 03/24/2008  . DEPRESSION  05/20/2009  . INSOMNIA-SLEEP DISORDER-UNSPEC 03/24/2008  . PSORIASIS 03/24/2008  . SKIN RASH 08/13/2009  . Seizure    Loss of Consciousness:  Denies Seizure History:  States she did have seizure in the past, which she thinks may be withdrawal related.  Allergies:  No Known Allergies PTA Medications: No prescriptions prior to admission   Previous Psychotropic Medications:  Medication/Dose  Paxil at age 2  Prozac-"After a few weeks I started having tremors."     Substance Abuse History in the last 12 months:  Yes.  - Relapsed a few days ago on cocaine, marijuana after going to a birthday party with a friend. Also was drinking heavily at the time with an alcohol level on admission of 243.  Consequences of Substance Abuse: 2 DUIS in the past and currently does not have license, (+) history of blackouts  Social History:  reports that she has been smoking Cigarettes.  She has been smoking about 0.50 packs per day. She does not have any smokeless tobacco history on file. She reports that she drinks about 4.8 oz of alcohol per week. She reports that she uses illicit drugs (Marijuana and Cocaine). 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 Currently not working. Military History:  None. Legal History: History of DUI charges  Hobbies/Interests:  Family History:  States her father has been diagnosed with bipolar disorder and is alcoholic.  Has one sister. No suicides in family. Family History  Problem Relation Age of Onset  . Diabetes Mother   . Anxiety disorder Mother    . Depression Father   . Diabetes Other     Results for orders placed or performed during the hospital encounter of 12/17/13 (from the past 72 hour(s))  Acetaminophen level     Status: None   Collection Time: 12/18/13 12:14 AM  Result Value Ref Range   Acetaminophen (Tylenol), Serum <15.0 10 - 30 ug/mL    Comment:        THERAPEUTIC CONCENTRATIONS VARY SIGNIFICANTLY. A RANGE OF 10-30 ug/mL MAY BE AN EFFECTIVE CONCENTRATION FOR MANY PATIENTS. HOWEVER, SOME ARE BEST TREATED AT CONCENTRATIONS OUTSIDE THIS RANGE. ACETAMINOPHEN CONCENTRATIONS >150 ug/mL AT 4 HOURS AFTER INGESTION AND >50 ug/mL AT 12 HOURS AFTER INGESTION ARE OFTEN ASSOCIATED WITH TOXIC REACTIONS.   CBC     Status: Abnormal   Collection Time: 12/18/13 12:14 AM  Result Value Ref Range   WBC 11.7 (H) 4.0 - 10.5 K/uL   RBC 4.59 3.87 - 5.11 MIL/uL   Hemoglobin 14.4 12.0 - 15.0 g/dL   HCT 43.9 36.0 - 46.0 %   MCV 95.6 78.0 - 100.0 fL   MCH 31.4 26.0 - 34.0 pg   MCHC 32.8 30.0 - 36.0 g/dL   RDW 12.9 11.5 - 15.5 %   Platelets 337 150 - 400 K/uL  Comprehensive metabolic panel     Status: Abnormal   Collection Time: 12/18/13 12:14 AM  Result Value Ref Range   Sodium 138 137 - 147 mEq/L   Potassium 3.6 (L) 3.7 - 5.3 mEq/L   Chloride 100 96 - 112 mEq/L   CO2 21 19 - 32 mEq/L   Glucose, Bld 79 70 - 99 mg/dL   BUN 7 6 - 23 mg/dL   Creatinine, Ser 0.56 0.50 - 1.10 mg/dL   Calcium 9.1 8.4 - 10.5 mg/dL   Total Protein 8.2 6.0 - 8.3 g/dL   Albumin 4.1 3.5 - 5.2 g/dL   AST 37 0 - 37 U/L   ALT 34 0 - 35 U/L   Alkaline Phosphatase 87 39 - 117 U/L   Total Bilirubin 0.2 (L) 0.3 - 1.2 mg/dL   GFR calc non Af Amer >90 >90 mL/min   GFR calc Af Amer >90 >90 mL/min    Comment: (NOTE) The eGFR has been calculated using the CKD EPI equation. This calculation has not been validated in all clinical situations. eGFR's persistently <90 mL/min signify possible Chronic Kidney Disease.    Anion gap 17 (H) 5 - 15  Ethanol (ETOH)      Status: Abnormal   Collection Time: 12/18/13 12:14 AM  Result Value Ref Range   Alcohol, Ethyl (B) 243 (H) 0 - 11 mg/dL    Comment:        LOWEST DETECTABLE LIMIT FOR SERUM ALCOHOL IS 11 mg/dL FOR MEDICAL PURPOSES ONLY   Salicylate level     Status: Abnormal   Collection Time: 12/18/13 12:14 AM  Result Value Ref Range   Salicylate Lvl <1.9 (L) 2.8 - 20.0 mg/dL  hCG, quantitative, pregnancy     Status: None   Collection Time:  12/18/13 12:14 AM  Result Value Ref Range   hCG, Beta Chain, Quant, S <1 <5 mIU/mL    Comment:          GEST. AGE      CONC.  (mIU/mL)   <=1 WEEK        5 - 50     2 WEEKS       50 - 500     3 WEEKS       100 - 10,000     4 WEEKS     1,000 - 30,000     5 WEEKS     3,500 - 115,000   6-8 WEEKS     12,000 - 270,000    12 WEEKS     15,000 - 220,000        FEMALE AND NON-PREGNANT FEMALE:     LESS THAN 5 mIU/mL   Urine Drug Screen     Status: Abnormal   Collection Time: 12/18/13  5:15 AM  Result Value Ref Range   Opiates NONE DETECTED NONE DETECTED   Cocaine POSITIVE (A) NONE DETECTED   Benzodiazepines POSITIVE (A) NONE DETECTED   Amphetamines NONE DETECTED NONE DETECTED   Tetrahydrocannabinol POSITIVE (A) NONE DETECTED   Barbiturates NONE DETECTED NONE DETECTED    Comment:        DRUG SCREEN FOR MEDICAL PURPOSES ONLY.  IF CONFIRMATION IS NEEDED FOR ANY PURPOSE, NOTIFY LAB WITHIN 5 DAYS.        LOWEST DETECTABLE LIMITS FOR URINE DRUG SCREEN Drug Class       Cutoff (ng/mL) Amphetamine      1000 Barbiturate      200 Benzodiazepine   329 Tricyclics       924 Opiates          300 Cocaine          300 THC              50   Basic metabolic panel     Status: None   Collection Time: 12/18/13  6:26 AM  Result Value Ref Range   Sodium 140 137 - 147 mEq/L   Potassium 4.1 3.7 - 5.3 mEq/L   Chloride 100 96 - 112 mEq/L   CO2 25 19 - 32 mEq/L   Glucose, Bld 90 70 - 99 mg/dL   BUN 8 6 - 23 mg/dL   Creatinine, Ser 0.59 0.50 - 1.10 mg/dL   Calcium 8.7  8.4 - 10.5 mg/dL   GFR calc non Af Amer >90 >90 mL/min   GFR calc Af Amer >90 >90 mL/min    Comment: (NOTE) The eGFR has been calculated using the CKD EPI equation. This calculation has not been validated in all clinical situations. eGFR's persistently <90 mL/min signify possible Chronic Kidney Disease.    Anion gap 15 5 - 15   Psychological Evaluations:  Assessment:    AXIS I:               Alcohol use disorder-severe  THC Use disorder moderate  Cocaine use disorder mild  Major depressive disorder-recurrent  R/O Bipolar disorder  AXIS II:  Deferred AXIS III:   Past Medical History  Diagnosis Date  . ANXIETY 05/20/2009  . DEPRESSION/ANXIETY 03/24/2008  . DEPRESSION 05/20/2009  . INSOMNIA-SLEEP DISORDER-UNSPEC 03/24/2008  . PSORIASIS 03/24/2008  . SKIN RASH 08/13/2009  . Seizure    AXIS IV:  recent break up with  boyfriend, recent relocation to [place], recently not working, does not  drive AXIS V:  86-76 serious symptoms  Treatment Plan/Recommendations:   1. Admit for crisis management and stabilization. Estimated length of stay 5-7 days. 2. Medication management to reduce current symptoms to base line and improve the patient's level of functioning.  3. Develop treatment plan to decrease risk of relapse upon discharge of depressive symptoms and the need for readmission. 5. Group therapy to facilitate development of healthy coping skills to use for depression and anxiety. 6. Health care follow up as needed for medical problems. Check TSH to rule out thyroid problems.  7. Discharge plan to include therapy to help patient cope with  stressors.  8. Call for Consult with Hospitalist for additional specialty patient services as needed.   Treatment Plan Summary: Daily contact with patient to assess and evaluate symptoms and progress in treatment Medication management See below  Current Medications:  Current Facility-Administered  Medications  Medication Dose Route Frequency Provider Last Rate Last Dose  . acetaminophen (TYLENOL) tablet 650 mg  650 mg Oral Q6H PRN Shuvon Rankin, NP      . alum & mag hydroxide-simeth (MAALOX/MYLANTA) 200-200-20 MG/5ML suspension 30 mL  30 mL Oral PRN Shuvon Rankin, NP      . chlordiazePOXIDE (LIBRIUM) capsule 25 mg  25 mg Oral Q6H PRN Shuvon Rankin, NP      . chlordiazePOXIDE (LIBRIUM) capsule 25 mg  25 mg Oral QID Shuvon Rankin, NP   25 mg at 12/19/13 0810   Followed by  . [START ON 12/20/2013] chlordiazePOXIDE (LIBRIUM) capsule 25 mg  25 mg Oral TID Shuvon Rankin, NP       Followed by  . [START ON 12/21/2013] chlordiazePOXIDE (LIBRIUM) capsule 25 mg  25 mg Oral BH-qamhs Shuvon Rankin, NP       Followed by  . [START ON 12/22/2013] chlordiazePOXIDE (LIBRIUM) capsule 25 mg  25 mg Oral Daily Shuvon Rankin, NP      . ibuprofen (ADVIL,MOTRIN) tablet 600 mg  600 mg Oral Q8H PRN Shuvon Rankin, NP      . loperamide (IMODIUM) capsule 2-4 mg  2-4 mg Oral PRN Shuvon Rankin, NP      . magnesium hydroxide (MILK OF MAGNESIA) suspension 30 mL  30 mL Oral Daily PRN Shuvon Rankin, NP      . multivitamin with minerals tablet 1 tablet  1 tablet Oral Daily Shuvon Rankin, NP   1 tablet at 12/19/13 0810  . nicotine (NICODERM CQ - dosed in mg/24 hours) patch 21 mg  21 mg Transdermal Daily Shuvon Rankin, NP   21 mg at 12/19/13 0810  . ondansetron (ZOFRAN) tablet 4 mg  4 mg Oral Q8H PRN Shuvon Rankin, NP      . QUEtiapine (SEROQUEL) tablet 25 mg  25 mg Oral TID PRN Jenne Campus, MD      . sertraline (ZOLOFT) tablet 50 mg  50 mg Oral Daily Fernando A Cobos, MD      . thiamine (VITAMIN B-1) tablet 100 mg  100 mg Oral Daily Shuvon Rankin, NP   100 mg at 12/19/13 0810  . traZODone (DESYREL) tablet 100 mg  100 mg Oral QHS PRN Shuvon Rankin, NP   100 mg at 12/18/13 2118   Facility-Administered Medications Ordered in Other Encounters  Medication Dose Route Frequency Provider Last Rate Last Dose  . HYDROmorphone  (DILAUDID) injection 2 mg  2 mg Intravenous Once Martie Lee, PA-C        Observation Level/Precautions:  15 minute checks  Laboratory:  Chemistry panel,  CBC, UDS positive for benzos, cocaine, and marijuana   Psychotherapy:  Individual and Group Therapy  Medications:  Start Zoloft 50 mg daily for depression, Seroquel 25 mg TID prn anxiety, Trazodone 100 mg hs prn insomnia, Librium protocol for alcohol detox  Consultations:   If needed   Discharge Concerns:  Continued substance abuse  Estimated LOS: 3-5 days  Other:  Increase collateral information from mother   I certify that inpatient services furnished can reasonably be expected to improve the patient's condition.   DAVIS, LAURA NP-C 12/10/201510:57 AM   I have discussed case with  NP as above, and have met with patient. Agree with NP's Note, Assessment, Plan. Patient is a 33 year old female, known to our unit from recent admission in September 2015. She has a history of Alcohol Dependence and Mood Disorder. Patient had stopped taking prescribed psychiatric medications and had relapsed on alcohol and more recently on drugs.   Patient was committed by her mother, due to concerns about her safety , and reported suicidal ideations. Patient denies any SI and minimizes any recent SI. She does states she has been drinking heavily, and recently went to a " party" where she abused multiple different substances. Her BAL upon admission was 243 and her UDS positive for cannabis, cocaine, benzodiazepines.. She does state" I am not suicidal, but right now I am a mess and I know I have to be here".  She is currently on Librium alcohol detox as per protocol, Will be started on Zoloft, and on Seroquel . Of note , patient had been started on Tegretol in ED but due to concerns of potential side effect profile will D/C.

## 2013-12-19 NOTE — [facility] Suicide Risk Assessment (Signed)
[facility] INPATIENT:  Family/Significant Other Suicide Prevention Education  Suicide Prevention Education:  Education Completed; mother Raelene BottDee Goodwin [phone],  (name of family member/significant other) has been identified by the patient as the family member/significant other with whom the patient will be residing, and identified as the person(s) who will aid the patient in the event of a mental health crisis (suicidal ideations/suicide attempt).  With written consent from the patient, the family member/significant other has been provided the following suicide prevention education, prior to the and/or following the discharge of the patient.  The suicide prevention education provided includes the following:  Suicide risk factors  Suicide prevention and interventions  National Suicide Hotline telephone number  [facility] assessment telephone number  [facility] Emergency Assistance 911  [facility] and/or Residential Mobile Crisis Unit telephone number  Request made of family/significant other to:  Remove weapons (e.g., guns, rifles, knives), all items previously/currently identified as safety concern.    Remove drugs/medications (over-the-counter, prescriptions, illicit drugs), all items previously/currently identified as a safety concern.  The family member/significant other verbalizes understanding of the suicide prevention education information provided.  The family member/significant other agrees to remove the items of safety concern listed above.  Katelyn Bridges, Katelyn Bridges 12/19/2013, 4:31 PM

## 2013-12-19 NOTE — Progress Notes (Signed)
Pt is new to the unit this evening, coming onto the unit shortly before shift change.  Pt reports that she just had a visit from her mother, who petitioned her, and it was not a good encounter.  She requested that her code number be changed and that she not have any interaction with her mother at this time.  Writer made note of pt's request.  Pt states she is having moderate withdrawal symptoms and chose to lay in bed instead of going to NA groups tonight.  Pt denies SI/HI/AV at this time.  Pt was placed on Librium protocol and medicated accordingly.  Pt was encouraged to make her needs known to staff.   Pt voiced understanding.  Support and encouragement offered.  Safety maintained with q15 minute checks.

## 2013-12-19 NOTE — Progress Notes (Signed)
D: Patient has depressed affect and mood; slightly anxious at times. She reported on the self inventory sheet that sleep, appetite and ability to concentrate are all good and energy level is low. Patient rates depression "4", feelings of hopelessness "0" and anxiety "9". She's attending the majority of groups today. Complained of anxiety. Patient is compliant with medication regimen.  A: Support and encouragement provided to patient. Scheduled medications administered per MD orders. PRN Seroquel given for anxiety. Maintain Q15 minute checks for safety.  R: Patient receptive. Denies SI/HI/AVH. Patient remains safe on the hall.

## 2013-12-19 NOTE — Progress Notes (Signed)
Did not attend group 

## 2013-12-19 NOTE — [facility] Suicide Risk Assessment (Signed)
   Nursing information obtained from:    Demographic factors:   33  Year old caucasian female Current Mental Status:   See below Loss Factors:   limited support network/ relapse  Historical Factors:   Mood Disorder, Alcohol Dependence  Risk Reduction Factors:   Resilience  Total Time spent with patient: 45 minutes  CLINICAL FACTORS:  Relapse on alcohol , cocaine and other substances. Worsening Depression, committed by mother due to reports of suicidal ideations and threatening behaviors  Psychiatric Specialty Exam: Physical Exam  ROS  Blood pressure 124/80, pulse 59, temperature 98.3 F (36.8 C), temperature source Oral, resp. rate 18, height 5' 2.5" (1.588 m), weight 66.679 kg (147 lb), last menstrual period 12/18/2013.Body mass index is 26.44 kg/(m^2).  General Appearance: Fairly Groomed  Patent attorneyye Contact::  Good  Speech:  Normal Rate  Volume:  Normal  Mood:  Dysphoric  Affect:  Constricted and mildly irritable  Thought Process:  Goal Directed and Linear  Orientation:  Other:  fully alert and attentive  Thought Content:  denies hallucinations, no delusions  Suicidal Thoughts:  No at this time denies any thoughts of hurting self and  contracts for safety on unit   Homicidal Thoughts:  No  Memory:  recent and remote grossly intact   Judgement:  Intact  Insight:  Lacking  Psychomotor Activity:  Normal  Concentration:  Good  Recall:  Good  Fund of Knowledge:Good  Language: Good  Akathisia:  Negative  Handed:  Right  AIMS (if indicated):     Assets:  Desire for Improvement Resilience  Sleep:      Musculoskeletal: Strength & Muscle Tone: within normal limits Gait & Station: normal Patient leans: N/A  COGNITIVE FEATURES THAT CONTRIBUTE TO RISK:  Closed-mindedness    SUICIDE RISK:   Moderate:  Frequent suicidal ideation with limited intensity, and duration, some specificity in terms of plans, no associated intent, good self-control, limited dysphoria/symptomatology, some risk  factors present, and identifiable protective factors, including available and accessible social support.  PLAN OF CARE: Patient will be admitted to inpatient psychiatric unit for stabilization and safety. Will provide and encourage milieu participation. Provide medication management and maked adjustments as needed.   Will also provide medication management to treat potential alcohol withdrawal. Will follow daily.    I certify that inpatient services furnished can reasonably be expected to improve the patient's condition.  COBOS, FERNANDO 12/19/2013, 2:56 PM

## 2013-12-19 NOTE — Progress Notes (Signed)
Patient did attend the evening karaoke group. Pt was supportive and attentive but did not participate by singing a song.

## 2013-12-19 NOTE — [facility] Group Notes (Signed)
[facility] LCSW Group Therapy 12/19/2013  1:15 PM   Type of Therapy: Group Therapy  Participation Level: Did Not Attend for unknown reason.   Samuella BruinKristin Breaunna Gottlieb, MSW, [employer]CSWA Clinical Social Worker [facility] [phone]

## 2013-12-19 NOTE — Clinical Social Work Note (Signed)
As patient requested, CSW informed patient's employer [employer] that patient is currently hospitalized.  [name], MSW, [employer]CSWA Clinical Social Worker [facility] [phone]

## 2013-12-19 NOTE — [facility] Group Notes (Signed)
The focus of this group is to educate the patient on the purpose and policies of crisis stabilization and provide a format to answer questions about their admission.  The group details unit policies and expectations of patients while admitted. Patient did not attend this group. 

## 2013-12-19 NOTE — Clinical Social Work Note (Signed)
CSW spoke with patient's mother who reports that patient is severely depressed and minimizing substance abuse. Patient's mother reports that she does not want patient to return without first receiving residential treatment. Mother was appropriately concerned for patient and appears to be supportive.  Samuella BruinKristin Cecilee Rosner, MSW, [employer]CSWA Clinical Social Worker [facility] [phone]

## 2013-12-20 DIAGNOSIS — F411 Generalized anxiety disorder: Secondary | ICD-10-CM

## 2013-12-20 LAB — TSH: TSH: 4.05 u[IU]/mL (ref 0.350–4.500)

## 2013-12-20 MED ORDER — QUETIAPINE FUMARATE 50 MG PO TABS
50.0000 mg | ORAL_TABLET | Freq: Three times a day (TID) | ORAL | Status: DC | PRN
  Administered 2013-12-20 (×2): 50 mg via ORAL
  Filled 2013-12-20 (×2): qty 1

## 2013-12-20 NOTE — Progress Notes (Signed)
D:Pt rates her anxiety as a 9, depression and hopelessness as a 4 on a 1-10 scale with 10 being the most. Pt is unsure where she will go following discharge. She denies any detox symptoms at this time.  A:Offered support, encouragement and 15 minute checks. R:Pt denies si and hi. Safety maintained on the unit.

## 2013-12-20 NOTE — [facility] Group Notes (Signed)
[facility]  12/20/2013  8:45 AM  Participation Quality: Did Not Attend- patient sleeping in bed.  Samuella BruinKristin Ellise Kovack, MSW, [employer]CSWA Clinical Social Worker [facility] [phone]

## 2013-12-20 NOTE — Clinical Social Work Note (Signed)
[facility] that patient's information was received but that there is waiting list several weeks long.   Samuella BruinKristin Leena Tiede, MSW, [employer]CSWA Clinical Social Worker [facility] [phone]

## 2013-12-20 NOTE — Progress Notes (Signed)
The patient attended this evening's A. A. Meeting and was appropriate.  

## 2013-12-20 NOTE — Progress Notes (Signed)
[facility] MD Progress Note  12/20/2013 4:37 PM Sharona Rovner  MRN:  [id number] Subjective:  Mahogony continues to have a hard time. She continues to detox with Librium. Her liver enzymes and her TSH are WNL. She is still dealing with a lot of anxiety. She is using the Seroquel 25 mg TID PRN but feels it is not strong enough. She hopes that the Zoloft is effective for her. She would like to go back to [facility]. She was there 4 months ago for 6 months and had to leave earlier as she got involved with " a boy" she can go back. States she really needs a long term program Diagnosis:   DSM5: Substance/Addictive Disorders:  Alcohol Related Disorder - Severe (303.90) Depressive Disorders:  Major Depressive Disorder - Severe (296.23) Total Time spent with patient: 30 minutes  Axis I: Generalized Anxiety Disorder  ADL's:  Intact  Sleep: Fair  Appetite:  Fair Psychiatric Specialty Exam: Physical Exam  Review of Systems  Constitutional: Negative.   HENT: Negative.   Eyes: Negative.   Respiratory: Negative.   Cardiovascular: Negative.   Gastrointestinal: Negative.   Genitourinary: Negative.   Musculoskeletal: Negative.   Skin: Negative.   Neurological: Negative.   Endo/Heme/Allergies: Negative.   Psychiatric/Behavioral: Positive for substance abuse. The patient is nervous/anxious.     Blood pressure 110/73, pulse 69, temperature 98.2 F (36.8 C), temperature source Oral, resp. rate 12, height 5' 2.5" (1.588 m), weight 66.679 kg (147 lb), last menstrual period 12/18/2013.Body mass index is 26.44 kg/(m^2).  General Appearance: Fairly Groomed  Patent attorney::  Fair  Speech:  Clear and Coherent  Volume:  Normal  Mood:  Anxious and Depressed  Affect:  anxious worried  Thought Process:  Coherent and Goal Directed  Orientation:  Full (Time, Place, and Person)  Thought Content:  symtpms events worries concerns  Suicidal Thoughts:  No  Homicidal Thoughts:  No  Memory:  Immediate;    Fair Recent;   Fair Remote;   Fair  Judgement:  Fair  Insight:  Present  Psychomotor Activity:  Restlessness  Concentration:  Fair  Recall:  [employer] of Knowledge:NA  Language: Fair  Akathisia:  No  Handed:    AIMS (if indicated):     Assets:  Desire for Improvement  Sleep:  Number of Hours: 6.25   Musculoskeletal: Strength & Muscle Tone: within normal limits Gait & Station: normal Patient leans: N/A  Current Medications: Current Facility-Administered Medications  Medication Dose Route Frequency Provider Last Rate Last Dose  . acetaminophen (TYLENOL) tablet 650 mg  650 mg Oral Q6H PRN Shuvon Rankin, NP      . alum & mag hydroxide-simeth (MAALOX/MYLANTA) 200-200-20 MG/5ML suspension 30 mL  30 mL Oral PRN Shuvon Rankin, NP      . chlordiazePOXIDE (LIBRIUM) capsule 25 mg  25 mg Oral Q6H PRN Shuvon Rankin, NP      . chlordiazePOXIDE (LIBRIUM) capsule 25 mg  25 mg Oral TID Shuvon Rankin, NP   25 mg at 12/20/13 1212   Followed by  . [START ON 12/21/2013] chlordiazePOXIDE (LIBRIUM) capsule 25 mg  25 mg Oral BH-qamhs Shuvon Rankin, NP       Followed by  . [START ON 12/22/2013] chlordiazePOXIDE (LIBRIUM) capsule 25 mg  25 mg Oral Daily Shuvon Rankin, NP      . ibuprofen (ADVIL,MOTRIN) tablet 600 mg  600 mg Oral Q8H PRN Shuvon Rankin, NP      . loperamide (IMODIUM) capsule 2-4 mg  2-4 mg  Oral PRN Shuvon Rankin, NP      . magnesium hydroxide (MILK OF MAGNESIA) suspension 30 mL  30 mL Oral Daily PRN Shuvon Rankin, NP   30 mL at 12/20/13 1431  . multivitamin with minerals tablet 1 tablet  1 tablet Oral Daily Shuvon Rankin, NP   1 tablet at 12/20/13 0804  . nicotine (NICODERM CQ - dosed in mg/24 hours) patch 21 mg  21 mg Transdermal Daily Shuvon Rankin, NP   21 mg at 12/20/13 0807  . ondansetron (ZOFRAN) tablet 4 mg  4 mg Oral Q8H PRN Shuvon Rankin, NP      . QUEtiapine (SEROQUEL) tablet 50 mg  50 mg Oral TID PRN Rachael FeeIrving A Arminta Gamm, MD   50 mg at 12/20/13 1313  . sertraline (ZOLOFT) tablet 50  mg  50 mg Oral Daily Craige CottaFernando A Cobos, MD   50 mg at 12/20/13 0803  . thiamine (VITAMIN B-1) tablet 100 mg  100 mg Oral Daily Shuvon Rankin, NP   100 mg at 12/20/13 0805  . traZODone (DESYREL) tablet 100 mg  100 mg Oral QHS PRN Shuvon Rankin, NP   100 mg at 12/19/13 2138   Facility-Administered Medications Ordered in Other Encounters  Medication Dose Route Frequency Provider Last Rate Last Dose  . HYDROmorphone (DILAUDID) injection 2 mg  2 mg Intravenous Once Angus SellerPeter S Dammen, PA-C        Lab Results:  Results for orders placed or performed during the hospital encounter of 12/18/13 (from the past 48 hour(s))  Hepatic function panel     Status: Abnormal   Collection Time: 12/19/13  7:54 PM  Result Value Ref Range   Total Protein 6.4 6.0 - 8.3 g/dL   Albumin 3.2 (L) 3.5 - 5.2 g/dL   AST 30 0 - 37 U/L   ALT 29 0 - 35 U/L   Alkaline Phosphatase 78 39 - 117 U/L   Total Bilirubin <0.2 (L) 0.3 - 1.2 mg/dL   Bilirubin, Direct <4.0<0.2 0.0 - 0.3 mg/dL   Indirect Bilirubin NOT CALCULATED 0.3 - 0.9 mg/dL    Comment: [facility]  TSH     Status: None   Collection Time: 12/20/13  6:40 AM  Result Value Ref Range   TSH 4.050 0.350 - 4.500 uIU/mL    Comment: [facility]    Physical Findings: AIMS: Facial and Oral Movements Muscles of Facial Expression: None, normal Lips and Perioral Area: None, normal Jaw: None, normal Tongue: None, normal,Extremity Movements Upper (arms, wrists, hands, fingers): None, normal Lower (legs, knees, ankles, toes): None, normal, Trunk Movements Neck, shoulders, hips: None, normal, Overall Severity Severity of abnormal movements (highest score from questions above): None, normal Incapacitation due to abnormal movements: None, normal Patient's awareness of abnormal movements (rate only patient's report): No Awareness, Dental Status Current problems with teeth and/or dentures?: No Does patient usually wear dentures?:  No  CIWA:  CIWA-Ar Total: 4 COWS:     Treatment Plan Summary: Daily contact with patient to assess and evaluate symptoms and progress in treatment Medication management  Plan: Supportive approach/coping skills/relapse prevention           Continue detox           Increase the Seroquel to 50 mg TID PRN anxiety  Medical Decision Making Problem Points:  Review of psycho-social stressors (1) Data Points:  Review of medication regiment & side effects (2) Review of new medications or change in dosage (2)  I certify that inpatient services furnished can reasonably be expected to improve the patient's condition.   Jenie Parish A 12/20/2013, 4:37 PM

## 2013-12-20 NOTE — Tx Team (Signed)
Interdisciplinary Treatment Plan Update (Adult) Date: 12/20/2013   Time Reviewed: 9:30 AM  Progress in Treatment: Attending groups: Continuing to assess Participating in groups: Continuing to assess Taking medication as prescribed: Yes Tolerating medication: Yes Family/Significant other contact made: Yes, CSW has spoken with patient's mother Patient understands diagnosis: Yes Discussing patient identified problems/goals with staff: Yes Medical problems stabilized or resolved: Yes Denies suicidal/homicidal ideation: Treatment team continuing to assess Issues/concerns per patient self-inventory: Yes Other:  New problem(s) identified: N/A  Discharge Plan or Barriers: CSW continuing to assess. Patient's mother likely to not let her return home unless she receives residential treatment for substance abuse first. Patient states that she has no other housing options at this time.  Reason for Continuation of Hospitalization:  Depression Anxiety Medication Stabilization   Comments: N/A  Estimated length of stay: 2-3 days  For review of initial/current patient goals, please see plan of care.  Patient is a 33 year old Caucasian female with a diagnosis of Alcohol use disorder, Moderate and Unspecified bipolar and related disorder. [place] a with her mother and step-father. Patient reports that she was IVC'd by her mother following an argument and that her mother "lied". Patient was calm and cooperative during assessment and reported that she knows that she needs to be here. She appeared lethargic and tearful. Patient reports regular alcohol use and depression, as well as difficulty adjusting to a recent break up and moving back home. Patient is unsure if she will be able to return home with her mother at discharge. [facility]. Patient will benefit from crisis stabilization, medication evaluation,  group therapy, and psycho education in addition to case management for discharge planning. Patient and CSW reviewed pt's identified goals and treatment plan. Patient identifies her goal as "to be happy." Pt verbalized understanding and agreed to treatment plan.   Attendees: Patient:    Family:    Physician: Dr. Dub MikesLugo 12/20/2013 9:30 AM  Nursing: Waynetta SandyJan Wright, RN 12/20/2013 9:30 AM  Clinical Social Worker: Belenda CruiseKristin Nuh Lipton, LCSWA 12/20/2013 9:30 AM  Other: Juline PatchQuylle Hodnett, LCSW 12/20/2013 9:30 AM  Other: Santa GeneraAnne Cunningham, LCSW 12/20/2013 9:30 AM  Other: Onnie BoerJennifer Clark, Case Manager 12/20/2013 9:30 AM  Other:  12/20/2013 9:30 AM  Other:    Other:      Scribe for Treatment Team:  Samuella BruinKristin Kellis Topete, MSW, [employer]CSWA [phone]

## 2013-12-20 NOTE — Clinical Social Work Note (Signed)
CSW met with patient to discuss mother's request that patient go to residential treatment prior to returning home. Patient was in bed, sleeping, reports that she feels unwell today due to having a cold. She is agreeable to CSW making a referral to [facility] at this time, although she has reservations about residential treatment at this time.   Tilden Fossa, MSW, [place] Worker [facility] [phone]

## 2013-12-20 NOTE — Clinical Social Work Note (Signed)
CSW met with patient to inform her that [facility] has waiting list. Patient reports that she does not want to pursue [facility] at this time and wants to talk to her mother before making a decision regarding her plans at discharge.  Tilden Fossa, MSW, [address] Worker [facility] [phone]

## 2013-12-20 NOTE — Clinical Social Work Note (Signed)
[facility] referral made.  Kenroy Timberman, MSW, LCSWA Clinical Social Worker [facility] [phone]  

## 2013-12-20 NOTE — [facility] Group Notes (Signed)
[facility] LCSW Group Therapy 12/20/2013  1:15 PM   Type of Therapy: Group Therapy  Participation Level: Did Not Attend. Patient invited to participate but declined due to feeling unwell.   Samuella BruinKristin Jameeka Marcy, MSW, [employer]CSWA Clinical Social Worker [facility] [phone]

## 2013-12-20 NOTE — Progress Notes (Signed)
Pt report she is doing better this evening.  At the beginning of the shift, pt was in the bed awake.  She reported that she was lying down because she was tired.  Pt reports she is having mild withdrawal symptoms off and on.  Pt has been up most of the day and attended groups.  She says she realizes that [facility].  Pt is taking her medications and participating.  Pt denies SI/HI/AV.  She is still depressed about her present life situation.  Later in the evening after karaoke, pt was found smoking in the shower.  She would not disclose where she got the cigarettes or the lighter, but the lighter was confiscated and no more cigarettes were found.  Pt was re-educated about the rules of the unit.  Pt voiced understanding.  Pt was offered support and encouragement.  Pt makes her needs known to staff.  Safety maintained with q15 minute checks.

## 2013-12-21 DIAGNOSIS — F39 Unspecified mood [affective] disorder: Secondary | ICD-10-CM

## 2013-12-21 MED ORDER — QUETIAPINE FUMARATE 50 MG PO TABS
75.0000 mg | ORAL_TABLET | Freq: Three times a day (TID) | ORAL | Status: DC
  Administered 2013-12-21 – 2013-12-23 (×6): 75 mg via ORAL
  Filled 2013-12-21 (×11): qty 1

## 2013-12-21 MED ORDER — QUETIAPINE FUMARATE 25 MG PO TABS
ORAL_TABLET | ORAL | Status: AC
  Filled 2013-12-21: qty 1

## 2013-12-21 MED ORDER — HYDROXYZINE HCL 25 MG PO TABS
25.0000 mg | ORAL_TABLET | Freq: Four times a day (QID) | ORAL | Status: DC | PRN
  Administered 2013-12-21 – 2013-12-26 (×4): 25 mg via ORAL
  Filled 2013-12-21: qty 30
  Filled 2013-12-21 (×4): qty 1

## 2013-12-21 MED ORDER — TRAZODONE HCL 150 MG PO TABS
150.0000 mg | ORAL_TABLET | Freq: Every evening | ORAL | Status: DC | PRN
  Administered 2013-12-21 – 2013-12-25 (×5): 150 mg via ORAL
  Filled 2013-12-21 (×5): qty 1
  Filled 2013-12-21: qty 14

## 2013-12-21 MED ORDER — QUETIAPINE FUMARATE 50 MG PO TABS
50.0000 mg | ORAL_TABLET | Freq: Three times a day (TID) | ORAL | Status: DC
  Administered 2013-12-21: 50 mg via ORAL
  Filled 2013-12-21 (×6): qty 1

## 2013-12-21 NOTE — Progress Notes (Signed)
Pt reports she is doing better this evening than earlier today and her withdrawal symptoms are decreasing.  She is unsure where she will be going after detox because [facility].  Her mom says she cannot return to her home until she completes a long term treatment program, and she knows she needs the additional treatment.  Pt has been observed in the milieu talking and socializing with her peers.  She denies SI/HI/AV at this time.  Pt reports she has been going to groups and participating with unit activities.  Pt makes her needs known to staff.  Support and encouragement offered.  Safety maintained with q15 minute checks.

## 2013-12-21 NOTE — [facility] Group Notes (Signed)
[facility] Group Notes:  (Nursing/MHT/Case Management/Adjunct)  Date:  12/21/2013  Time:  1:37 PM  Type of Therapy:  Psychoeducational Skills  Participation Level:  Active  Participation Quality:  Appropriate  Affect:  Appropriate  Cognitive:  Appropriate  Insight:  Appropriate  Engagement in Group:  Engaged  Modes of Intervention:  Discussion  Summary of Progress/Problems: Pt did attend self inventory group, pt reported that she was negative SI/HI, no AH/VH noted. Pt rated her depression as a 9, and her helplessness/hopelessness as a 7.     Pt reported concerns about her having severe anxiety with no relief with current medication, Aggie NP made aware.     Jacquelyne BalintForrest, Kainon Varady Shanta 12/21/2013, 1:37 PM

## 2013-12-21 NOTE — Progress Notes (Signed)
[facility]  12/21/2013 5:22 PM Katelyn HippoJennifer Brooke Bridges  MRN:  [id number] Subjective:  Admits she is very upset because her mother pretty much has said that she will not allow her back unless she goes to a program. ( [facility] was mentioned) She states she does not need to go to a program She states she has been away and she wants to be home with her family this Christmas. She admits to feeling agitated. She asked about a possible increase in the Seroquel. She meanwhile continues being detox with Librium Diagnosis:   DSM5: Substance/Addictive Disorders:  Alcohol Related Disorder - Severe (303.90) Depressive Disorders:  Major Depressive Disorder - Severe (296.23) Total Time spent with patient: 30 minutes  Axis I: Generalized Anxiety Disorder and Mood Disorder NOS  ADL's:  Intact  Sleep: Fair  Appetite:  Fair  Psychiatric Specialty Exam: Physical Exam  Review of Systems  Constitutional: Negative.   HENT: Negative.   Eyes: Negative.   Respiratory: Negative.   Cardiovascular: Negative.   Gastrointestinal: Negative.   Genitourinary: Negative.   Musculoskeletal: Negative.   Skin: Negative.   Neurological: Negative.   Endo/Heme/Allergies: Negative.   Psychiatric/Behavioral: Positive for depression and substance abuse. The patient is nervous/anxious.     Blood pressure 114/79, pulse 61, temperature 97.7 F (36.5 C), temperature source Oral, resp. rate 16, height 5' 2.5" (1.588 m), weight 66.679 kg (147 lb), last menstrual period 12/18/2013.Body mass index is 26.44 kg/(m^2).  General Appearance: Fairly Groomed  Patent attorneyye Contact::  Fair  Speech:  Clear and Coherent  Volume:  fluctuates  Mood:  Anxious, Depressed and Irritable  Affect:  irritable  Thought Process:  Coherent and Goal Directed  Orientation:  Full (Time, Place, and Person)  Thought Content:  events worries concerns conflict with her mother  Suicidal Thoughts:  No  Homicidal Thoughts:  No  Memory:  Immediate;    Fair Recent;   Fair Remote;   Fair  Judgement:  Fair  Insight:  Present  Psychomotor Activity:  Restlessness  Concentration:  Fair  Recall:  [employer]Fair  Fund of Knowledge:NA  Language: Fair  Akathisia:  No  Handed:    AIMS (if indicated):     Assets:  Desire for Improvement  Sleep:  Number of Hours: 6.25   Musculoskeletal: Strength & Muscle Tone: within normal limits Gait & Station: normal Patient leans: N/A  Current Medications: Current Facility-Administered Medications  Medication Dose Route Frequency Provider Last Rate Last Dose  . acetaminophen (TYLENOL) tablet 650 mg  650 mg Oral Q6H PRN Shuvon Rankin, NP      . alum & mag hydroxide-simeth (MAALOX/MYLANTA) 200-200-20 MG/5ML suspension 30 mL  30 mL Oral PRN Shuvon Rankin, NP      . chlordiazePOXIDE (LIBRIUM) capsule 25 mg  25 mg Oral Q6H PRN Shuvon Rankin, NP   25 mg at 12/21/13 1455  . chlordiazePOXIDE (LIBRIUM) capsule 25 mg  25 mg Oral BH-qamhs Shuvon Rankin, NP   25 mg at 12/21/13 0809   Followed by  . [START ON 12/22/2013] chlordiazePOXIDE (LIBRIUM) capsule 25 mg  25 mg Oral Daily Shuvon Rankin, NP      . hydrOXYzine (ATARAX/VISTARIL) tablet 25 mg  25 mg Oral Q6H PRN Sanjuana KavaAgnes I Nwoko, NP   25 mg at 12/21/13 1455  . ibuprofen (ADVIL,MOTRIN) tablet 600 mg  600 mg Oral Q8H PRN Shuvon Rankin, NP      . loperamide (IMODIUM) capsule 2-4 mg  2-4 mg Oral PRN Shuvon Rankin, NP      .  magnesium hydroxide (MILK OF MAGNESIA) suspension 30 mL  30 mL Oral Daily PRN Shuvon Rankin, NP   30 mL at 12/20/13 1431  . multivitamin with minerals tablet 1 tablet  1 tablet Oral Daily Shuvon Rankin, NP   1 tablet at 12/21/13 0810  . nicotine (NICODERM CQ - dosed in mg/24 hours) patch 21 mg  21 mg Transdermal Daily Shuvon Rankin, NP   21 mg at 12/21/13 0810  . ondansetron (ZOFRAN) tablet 4 mg  4 mg Oral Q8H PRN Shuvon Rankin, NP      . QUEtiapine (SEROQUEL) tablet 75 mg  75 mg Oral TID Rachael FeeIrving A Mesha Schamberger, MD   75 mg at 12/21/13 1712  . sertraline (ZOLOFT)  tablet 50 mg  50 mg Oral Daily Craige CottaFernando A Cobos, MD   50 mg at 12/21/13 0809  . thiamine (VITAMIN B-1) tablet 100 mg  100 mg Oral Daily Shuvon Rankin, NP   100 mg at 12/21/13 0809  . traZODone (DESYREL) tablet 150 mg  150 mg Oral QHS PRN Sanjuana KavaAgnes I Nwoko, NP       Facility-Administered Medications Ordered in Other Encounters  Medication Dose Route Frequency Provider Last Rate Last Dose  . HYDROmorphone (DILAUDID) injection 2 mg  2 mg Intravenous Once Angus SellerPeter S Dammen, PA-C        Lab Results:  Results for orders placed or performed during the hospital encounter of 12/18/13 (from the past 48 hour(s))  Hepatic function panel     Status: Abnormal   Collection Time: 12/19/13  7:54 PM  Result Value Ref Range   Total Protein 6.4 6.0 - 8.3 g/dL   Albumin 3.2 (L) 3.5 - 5.2 g/dL   AST 30 0 - 37 U/L   ALT 29 0 - 35 U/L   Alkaline Phosphatase 78 39 - 117 U/L   Total Bilirubin <0.2 (L) 0.3 - 1.2 mg/dL   Bilirubin, Direct <1.6<0.2 0.0 - 0.3 mg/dL   Indirect Bilirubin NOT CALCULATED 0.3 - 0.9 mg/dL    Comment: [facility]  TSH     Status: None   Collection Time: 12/20/13  6:40 AM  Result Value Ref Range   TSH 4.050 0.350 - 4.500 uIU/mL    Comment: [facility]formed [facility]    Physical Findings: AIMS: Facial and Oral Movements Muscles of Facial Expression: None, normal Lips and Perioral Area: None, normal Jaw: None, normal Tongue: None, normal,Extremity Movements Upper (arms, wrists, hands, fingers): None, normal Lower (legs, knees, ankles, toes): None, normal, Trunk Movements Neck, shoulders, hips: None, normal, Overall Severity Severity of abnormal movements (highest score from questions above): None, normal Incapacitation due to abnormal movements: None, normal Patient's awareness of abnormal movements (rate only patient's report): No Awareness, Dental Status Current problems with teeth and/or dentures?: No Does patient usually wear dentures?: No  CIWA:   CIWA-Ar Total: 6 COWS:     Treatment Plan Summary: Daily contact with patient to assess and evaluate symptoms and progress in treatment Medication management  Plan: Supportive approach/coping skills/relapse prevention           Pursue detox further           CBT: mindfulness           Increase the Seroquel to 75 mg TID           Reassess for possible increase of the Zoloft  Medical Decision Making Problem Points:  Established problem, worsening (2) and Review of psycho-social stressors (1) Data Points:  Review of medication regiment & side effects (2) Review of new medications or change in dosage (2)  I certify that inpatient services furnished can reasonably be expected to improve the patient's condition.   Taytem Ghattas A 12/21/2013, 5:22 PM

## 2013-12-21 NOTE — Progress Notes (Signed)
Patient ID: Katelyn Bridges, female   DOB: 07/09/1980, 33 y.o.   MRN: [id number]   D: Pt has been very flat and depressed on the unit, pt has also been very tearful due to withdrawals and problems with mother. Pt reported that her medication was not helping her and that she needed something. Pt had requested prn medication as often as she could have it, and yet received no relief. Aggie NP was made aware of situation, new orders were noted. Pt was advised of new medication changes, and seemed to be pleased.  Pt reported being negative SI/HI, no AH/VH noted. A: 15 min checks continued for patient safety. R: Pt safety maintained.

## 2013-12-21 NOTE — Progress Notes (Signed)
Psychoeducational Group Note  Date: 12/21/2013 Time:  1015  Group Topic/Focus:  Identifying Needs:   The focus of this group is to help patients identify their personal needs that have been historically problematic and identify healthy behaviors to address their needs.  Participation Level:  Active  Participation Quality:  Appropriate  Affect:  Appropriate  Cognitive:  Oriented  Insight:  Improving  Engagement in Group:  Engaged  Additional Comments:  Pt attending and participating in the discussion. Shows insight.  Aengus Sauceda A  

## 2013-12-21 NOTE — Progress Notes (Signed)
[facility] Group Notes:  (Nursing/MHT/Case Management/Adjunct)  Date:  12/21/2013  Time:  10:36 PM  Type of Therapy:  Wrap Up Group  Participation Level:  Active  Participation Quality:  Attentive  Affect:  Appropriate  Cognitive:  Appropriate  Insight:  Improving  Engagement in Group:  Improving  Modes of Intervention:  Education  Summary of Progress/Problems: The patient indicated in group that she had a good day overall until she spoke with her mother over the phone. While the patient did not explain the telephone call, she did verbalize that she was upset at that time and dealt with the situation by talking with her nurse. The patient's goal for tomorrow is to try "not to get pissed off".   Hazle CocaGOODMAN, Katelyn Oshana S 12/21/2013, 10:36 PM

## 2013-12-22 MED ORDER — QUETIAPINE FUMARATE 100 MG PO TABS
100.0000 mg | ORAL_TABLET | Freq: Every day | ORAL | Status: DC
  Administered 2013-12-22 – 2013-12-23 (×2): 100 mg via ORAL
  Filled 2013-12-22 (×3): qty 1

## 2013-12-22 MED ORDER — CHLORDIAZEPOXIDE HCL 25 MG PO CAPS
25.0000 mg | ORAL_CAPSULE | Freq: Three times a day (TID) | ORAL | Status: DC | PRN
  Administered 2013-12-22 – 2013-12-24 (×4): 25 mg via ORAL
  Filled 2013-12-22 (×4): qty 1

## 2013-12-22 MED ORDER — CLOBETASOL PROPIONATE 0.05 % EX CREA
TOPICAL_CREAM | Freq: Two times a day (BID) | CUTANEOUS | Status: DC
  Administered 2013-12-22 – 2013-12-23 (×2): via TOPICAL
  Administered 2013-12-24: 1 via TOPICAL
  Administered 2013-12-25 – 2013-12-26 (×2): via TOPICAL
  Filled 2013-12-22: qty 15

## 2013-12-22 NOTE — Plan of Care (Signed)
Problem: Ineffective individual coping Goal: STG: Patient will remain free from self harm Outcome: Progressing Patient remains free from self harm. 15 minute checks completed per protocol for patient safety.   Problem: Alteration in mood & ability to function due to Goal: LTG-Pt reports reduction in suicidal thoughts (Patient reports reduction in suicidal thoughts and is able to verbalize a safety plan for whenever patient is feeling suicidal)  Outcome: Progressing Patient denies SI thoughts today. Goal: STG-Patient will comply with prescribed medication regimen (Patient will comply with prescribed medication regimen)  Outcome: Progressing Patient complies with medication regimen today.  Problem: Alteration in mood; excessive anxiety as evidenced by: Goal: STG-Pt can identify how behaviors/thoughts can (Patient can identify how behaviors/thoughts can increase and decrease anxiety)  Outcome: Progressing Patient is able to identify thoughts that increase anxiety including certain group topics that serve as a trigger for her anxiety.  Comments:  Alena BillsGuthrie, Hortense Cantrall A, RN

## 2013-12-22 NOTE — Progress Notes (Signed)
Patient ID: Katelyn Bridges, female   DOB: 07/21/1980, 33 y.o.   MRN: [id number] D)  Has been pleasant and cooperative this evening, interacting appropriately with staff and peers.  Asking about hs seroquel, afraid she wouldn't sleep, but seemed to think the increase in hs trazodone would be effective.  Attended group and came to nursing station afterward to get hs meds.  States has had a pretty good evening, feeling a little better. A)  Will continue to monitor for w/d sx, and for safety, continue POC, support and encouragement. R)  Safety maintained.

## 2013-12-22 NOTE — Progress Notes (Signed)
[facility] Group Notes:  (Nursing/MHT/Case Management/Adjunct)  Date:  12/22/2013  Time:  8:37 AM  Type of Therapy:  Psychoeducational Skills  Participation Level:  Active  Participation Quality:  Appropriate, Attentive, Sharing and Supportive  Affect:  Appropriate  Cognitive:  Appropriate  Insight:  Appropriate  Engagement in Group:  Engaged  Modes of Intervention:  Activity  Summary of Progress/Problems:  Caswell CorwinOwen, Aurther Harlin C 12/22/2013, 8:37 AM

## 2013-12-22 NOTE — Progress Notes (Signed)
Psychoeducational Group Note  Date:  12/22/2013 Time: 1015 Group Topic/Focus:  Making Healthy Choices:   The focus of this group is to help patients identify negative/unhealthy choices they were using prior to admission and identify positive/healthier coping strategies to replace them upon discharge.  Participation Level:  Minimal  Participation Quality:  Attentive  Affect:  Appropriate  Cognitive:  Alert  Insight:  Engaged  Engagement in Group:  Engaged  Additional Comments:    Rich BraveDuke, Cayley Pester Lynn 6:46 PM. 12/22/2013

## 2013-12-22 NOTE — Progress Notes (Signed)
D: Patient is alert and oriented. Pt's mood and affect is depressed, anxious, and flat. Pt rates depression 9/10, hopelessness 6/10, and anxiety 10/10. Pt complains of withdrawal today including shakiness, anxiety, and constipation. Pt denies SI/HI and AVH at this time. Pt is attending groups. Pt reports her goal for the day is to "go to group and not take a nap throughout the day." Pt reports desire to "get motivated no matter what." Pt complains of psoriasis on her abdomen and requests topical medication. A: Support/Encouragement provided to pt. Provider, [place] made aware of pt's skin condition, new orders acknowledged. PRN medications administered for anxiety, withdrawal, and constipation per providers orders (See MAR). Scheduled medications administered per providers orders (See MAR). 15 minute checks completed per protocol for pt safety. R: Pt cooperative and receptive to nursing interventions.

## 2013-12-22 NOTE — [facility] Group Notes (Signed)
[facility] Group Notes:  (Nursing/MHT/Case Management/Adjunct)  Date:  12/22/2013  Time:  1045am  Type of Therapy:  Life skills group  Participation Level:  Active  Participation Quality:  Appropriate and Attentive  Affect:  Appropriate  Cognitive:  Alert and Appropriate  Insight:  Appropriate and Good  Engagement in Group:  Engaged  Modes of Intervention:  Discussion, Education and Support  Summary of Progress/Problems: Patient was alert and responded appropriately in group and reports her energy level is 3/10 and reports desire to "get happy like my old self."  Laurine BlazerGuthrie, GrenadaBrittany A 12/22/2013, 4:03 PM

## 2013-12-22 NOTE — Progress Notes (Signed)
The patient attended group this evening and was appropriate.  

## 2013-12-22 NOTE — [facility] Group Notes (Signed)
[facility] Group Notes:  (Nursing/MHT/Case Management/Adjunct)  Date:  12/22/2013  Time:  0945am  Type of Therapy:  Nurse Education  Participation Level:  Active  Participation Quality:  Appropriate  Affect:  Appropriate  Cognitive:  Alert and Appropriate  Insight:  Appropriate and Good  Engagement in Group:  Engaged  Modes of Intervention:  Education and Support  Summary of Progress/Problems: Patient was alert and responded appropriately in group. Pt reports her three supporters are "god, family, and friends." Pt states she takes like "one day at a time."  ZambiaGuthrie, GrenadaBrittany A 12/22/2013, 3:09 PM

## 2013-12-22 NOTE — Progress Notes (Signed)
[facility]  12/22/2013 3:36 PM Katelyn HippoJennifer Brooke Bridges  MRN:  [id number] Subjective:  Having a hard time. States she continues to have conflict with her mother. She wants her to go to a residential treatment and she does not see the need to do so. She wants to be home for Christmas. Does not feel her mother is going to let her be homeless but admits she is very upset with the situation. She states she is having a lot of anxiety, agitation and the Librium was helping. Asked to extend the Librium at lest for the next couple of days. She hopes to be able to stay here longer Diagnosis:   DSM5: Substance/Addictive Disorders:  Alcohol Related Disorder - Severe (303.90) Depressive Disorders:  Major Depressive Disorder - Severe (296.23) Total Time spent with patient: 30 minutes  Axis I: Generalized Anxiety Disorder  ADL's:  Intact  Sleep: Fair  Appetite:  Fair Psychiatric Specialty Exam: Physical Exam  Review of Systems  Constitutional: Negative.   HENT: Negative.   Eyes: Negative.   Respiratory: Negative.   Cardiovascular: Negative.   Gastrointestinal: Negative.   Genitourinary: Negative.   Musculoskeletal: Negative.   Skin: Negative.   Neurological: Negative.   Endo/Heme/Allergies: Negative.   Psychiatric/Behavioral: Positive for depression and substance abuse. The patient is nervous/anxious.     Blood pressure 107/65, pulse 61, temperature 98 F (36.7 C), temperature source Oral, resp. rate 18, height 5' 2.5" (1.588 m), weight 66.679 kg (147 lb), last menstrual period 12/18/2013.Body mass index is 26.44 kg/(m^2).  General Appearance: Fairly Groomed  Patent attorneyye Contact::  Fair  Speech:  Clear and Coherent  Volume:  Normal  Mood:  Anxious and worried  Affect:  anxious worried  Thought Process:  Coherent and Goal Directed  Orientation:  Full (Time, Place, and Person)  Thought Content:  symptoms events worries concerns, conflict wiht her mother and how her mother's behavior  triggers her  Suicidal Thoughts:  No  Homicidal Thoughts:  No  Memory:  Immediate;   Fair Recent;   Fair Remote;   Fair  Judgement:  Fair  Insight:  Present  Psychomotor Activity:  Restlessness  Concentration:  Fair  Recall:  FiservFair  Fund of Knowledge:NA  Language: Fair  Akathisia:  No  Handed:    AIMS (if indicated):     Assets:  Desire for Improvement  Sleep:  Number of Hours: 6.25   Musculoskeletal: Strength & Muscle Tone: within normal limits Gait & Station: normal Patient leans: N/A  Current Medications: Current Facility-Administered Medications  Medication Dose Route Frequency Provider Last Rate Last Dose  . acetaminophen (TYLENOL) tablet 650 mg  650 mg Oral Q6H PRN Shuvon Rankin, NP      . alum & mag hydroxide-simeth (MAALOX/MYLANTA) 200-200-20 MG/5ML suspension 30 mL  30 mL Oral PRN Shuvon Rankin, NP      . chlordiazePOXIDE (LIBRIUM) capsule 25 mg  25 mg Oral TID PRN Rachael FeeIrving A Calden Dorsey, MD   25 mg at 12/22/13 1307  . hydrOXYzine (ATARAX/VISTARIL) tablet 25 mg  25 mg Oral Q6H PRN Sanjuana KavaAgnes I Nwoko, NP   25 mg at 12/22/13 1156  . ibuprofen (ADVIL,MOTRIN) tablet 600 mg  600 mg Oral Q8H PRN Shuvon Rankin, NP      . magnesium hydroxide (MILK OF MAGNESIA) suspension 30 mL  30 mL Oral Daily PRN Shuvon Rankin, NP   30 mL at 12/22/13 1307  . multivitamin with minerals tablet 1 tablet  1 tablet Oral Daily Shuvon Rankin, NP  1 tablet at 12/22/13 16100812  . nicotine (NICODERM CQ - dosed in mg/24 hours) patch 21 mg  21 mg Transdermal Daily Shuvon Rankin, NP   21 mg at 12/22/13 [id number]  . ondansetron (ZOFRAN) tablet 4 mg  4 mg Oral Q8H PRN Shuvon Rankin, NP      . QUEtiapine (SEROQUEL) tablet 100 mg  100 mg Oral QHS Rachael FeeIrving A Taimi Towe, MD      . QUEtiapine (SEROQUEL) tablet 75 mg  75 mg Oral TID Rachael FeeIrving A Rael Yo, MD   75 mg at 12/22/13 1152  . sertraline (ZOLOFT) tablet 50 mg  50 mg Oral Daily Craige CottaFernando A Cobos, MD   50 mg at 12/22/13 [id number]  . thiamine (VITAMIN B-1) tablet 100 mg  100 mg Oral Daily Shuvon  Rankin, NP   100 mg at 12/22/13 0811  . traZODone (DESYREL) tablet 150 mg  150 mg Oral QHS PRN Sanjuana KavaAgnes I Nwoko, NP   150 mg at 12/21/13 2231   Facility-Administered Medications Ordered in Other Encounters  Medication Dose Route Frequency Provider Last Rate Last Dose  . HYDROmorphone (DILAUDID) injection 2 mg  2 mg Intravenous Once Angus SellerPeter S Dammen, PA-C        Lab Results: No results found for this or any previous visit (from the past 48 hour(s)).  Physical Findings: AIMS: Facial and Oral Movements Muscles of Facial Expression: None, normal Lips and Perioral Area: None, normal Jaw: None, normal Tongue: None, normal,Extremity Movements Upper (arms, wrists, hands, fingers): None, normal Lower (legs, knees, ankles, toes): None, normal, Trunk Movements Neck, shoulders, hips: None, normal, Overall Severity Severity of abnormal movements (highest score from questions above): None, normal Incapacitation due to abnormal movements: None, normal Patient's awareness of abnormal movements (rate only patient's report): No Awareness, Dental Status Current problems with teeth and/or dentures?: No Does patient usually wear dentures?: No  CIWA:  CIWA-Ar Total: 2 COWS:     Treatment Plan Summary: Daily contact with patient to assess and evaluate symptoms and progress in treatment Medication management  Plan:  Supportive approach/coping skills/elapse prevention            Extend the Librium PRN for the next 24-48 Hrs             Medical Decision Making Problem Points:  Review of psycho-social stressors (1) Data Points:  Review of medication regiment & side effects (2)  I certify that inpatient services furnished can reasonably be expected to improve the patient's condition.   Shaterra Sanzone A 12/22/2013, 3:36 PM

## 2013-12-23 DIAGNOSIS — F1190 Opioid use, unspecified, uncomplicated: Secondary | ICD-10-CM

## 2013-12-23 DIAGNOSIS — F331 Major depressive disorder, recurrent, moderate: Secondary | ICD-10-CM

## 2013-12-23 MED ORDER — GABAPENTIN 100 MG PO CAPS
100.0000 mg | ORAL_CAPSULE | Freq: Three times a day (TID) | ORAL | Status: DC
  Administered 2013-12-23 – 2013-12-24 (×2): 100 mg via ORAL
  Filled 2013-12-23 (×5): qty 1

## 2013-12-23 MED ORDER — GABAPENTIN 100 MG PO CAPS
100.0000 mg | ORAL_CAPSULE | Freq: Every day | ORAL | Status: DC
  Administered 2013-12-23: 100 mg via ORAL
  Filled 2013-12-23 (×4): qty 1

## 2013-12-23 MED ORDER — QUETIAPINE FUMARATE 100 MG PO TABS
100.0000 mg | ORAL_TABLET | Freq: Three times a day (TID) | ORAL | Status: DC
  Administered 2013-12-23 – 2013-12-24 (×2): 100 mg via ORAL
  Filled 2013-12-23 (×6): qty 1

## 2013-12-23 NOTE — [facility] Group Notes (Signed)
[facility]   12/23/2013 11:19 AM  Participation Quality:  Did not attend. Patient in bed, invited to attend but declined.   Hyatt,Candace

## 2013-12-23 NOTE — Progress Notes (Signed)
Patient ID: Katelyn HippoJennifer Brooke Bridges, female   DOB: 12/02/1980, 33 y.o.   MRN: [id number] D)   Has been out and about on the hall this evening, attended group, has been interacting appropriately with staff and select peers.  Still working on discharge plan and her issues with her mother, but feels is making progress.  Asked for librium at hs to help with her anxiety and sleep.   A)  Will continue to monitor for safety, support, continue POC R)  Safety maintained.

## 2013-12-23 NOTE — Progress Notes (Signed)
Adult Psychoeducational Group Note  Date:  12/23/2013 Time:  10:00AM  Group Topic/Focus:  Making Healthy Choices:   The focus of this group is to help patients identify negative/unhealthy choices they were using prior to admission and identify positive/healthier coping strategies to replace them upon discharge.  Participation Level:  Did Not Attend  Additional Comments:  Pt did not attend group. Pt was in her room, in the bed asleep during group time  Jerl Munyan K 12/23/2013, 11:21 AM

## 2013-12-23 NOTE — Clinical Social Work Note (Signed)
With patient permission, CSW spoke with patient's mother Raelene BottDee Goodwin regarding patient's progress and discharge plans. Patient's mother remains concerned about's anger, depression, and substance use. Mother would like for patient to go to residential treatment prior to returning home, although patient continues to decline. Mother is requesting a family meeting prior to patient discharge. Patient's mother became tearful and discussed her love and concern for patient's wellbeing.  Samuella BruinKristin Ephrem Carrick, MSW, [employer]CSWA Clinical Social Worker [facility] [phone]

## 2013-12-23 NOTE — Progress Notes (Signed)
[facility] MD Progress Note  12/23/2013 4:49 PM Katelyn HippoJennifer Brooke Bridges  MRN:  [id number] Subjective:  States she has been more anxious agitated. She is "shaking" states she knows she needs to come off the Librium. Does feel the Seroquel helping some but not enough. States she cant deal with this anxiety. She continues to have conflictive interactions with her mother. She states she just wants her to allow her to be home for Christmas and follow up outpatient basis. Does not feel a residential treatment will be necessary. Admits she gets easily overwhelmed. She uses alcohol to deal with these moods. Diagnosis:   DSM5: Substance/Addictive Disorders:  Alcohol Related Disorder - Severe (303.90) and Opioid Disorder - Severe (304.00) Depressive Disorders:  Major Depressive Disorder - Moderate (296.22) Total Time spent with patient: 30 minutes  Axis I: Generalized Anxiety Disorder  ADL's:  Intact  Sleep: Poor  Appetite:  Fair  Psychiatric Specialty Exam: Physical Exam  Review of Systems  Constitutional: Negative.   HENT: Negative.   Eyes: Negative.   Respiratory: Negative.   Cardiovascular: Negative.   Gastrointestinal: Negative.   Genitourinary: Negative.   Musculoskeletal: Positive for myalgias.  Skin: Negative.   Neurological: Positive for tremors.  Endo/Heme/Allergies: Negative.   Psychiatric/Behavioral: Positive for depression and substance abuse. The patient is nervous/anxious and has insomnia.     Blood pressure 103/73, pulse 64, temperature 98 F (36.7 C), temperature source Oral, resp. rate 16, height 5' 2.5" (1.588 m), weight 66.679 kg (147 lb), last menstrual period 12/18/2013.Body mass index is 26.44 kg/(m^2).  General Appearance: Fairly Groomed  Patent attorneyye Contact::  Fair  Speech:  Clear and Coherent  Volume:  fluctuates  Mood:  Anxious, Depressed and Dysphoric  Affect:  anxious, tremulous  Thought Process:  Coherent and Goal Directed  Orientation:  Full (Time, Place, and  Person)  Thought Content:  symptoms somatically focused, worries, concerns  Suicidal Thoughts:  No  Homicidal Thoughts:  No  Memory:  Immediate;   Fair Recent;   Fair Remote;   Fair  Judgement:  Fair  Insight:  Present  Psychomotor Activity:  Restlessness  Concentration:  Fair  Recall:  FiservFair  Fund of Knowledge:NA  Language: Fair  Akathisia:  No  Handed:    AIMS (if indicated):     Assets:  Desire for Improvement  Sleep:  Number of Hours: 5.75   Musculoskeletal: Strength & Muscle Tone: within normal limits Gait & Station: normal Patient leans: N/A  Current Medications: Current Facility-Administered Medications  Medication Dose Route Frequency Provider Last Rate Last Dose  . acetaminophen (TYLENOL) tablet 650 mg  650 mg Oral Q6H PRN Shuvon Rankin, NP      . alum & mag hydroxide-simeth (MAALOX/MYLANTA) 200-200-20 MG/5ML suspension 30 mL  30 mL Oral PRN Shuvon Rankin, NP      . chlordiazePOXIDE (LIBRIUM) capsule 25 mg  25 mg Oral TID PRN Rachael FeeIrving A Ansleigh Safer, MD   25 mg at 12/23/13 [id number]  . clobetasol cream (TEMOVATE) 0.05 %   Topical BID Rachael FeeIrving A Shyanne Mcclary, MD      . gabapentin (NEURONTIN) capsule 100 mg  100 mg Oral TID Rachael FeeIrving A Jet Armbrust, MD      . gabapentin (NEURONTIN) capsule 100 mg  100 mg Oral QHS Rachael FeeIrving A Jennie Hannay, MD      . hydrOXYzine (ATARAX/VISTARIL) tablet 25 mg  25 mg Oral Q6H PRN Sanjuana KavaAgnes I Nwoko, NP   25 mg at 12/22/13 1156  . ibuprofen (ADVIL,MOTRIN) tablet 600 mg  600 mg Oral Q8H  PRN Shuvon Rankin, NP      . magnesium hydroxide (MILK OF MAGNESIA) suspension 30 mL  30 mL Oral Daily PRN Shuvon Rankin, NP   30 mL at 12/22/13 1307  . multivitamin with minerals tablet 1 tablet  1 tablet Oral Daily Shuvon Rankin, NP   1 tablet at 12/23/13 0813  . nicotine (NICODERM CQ - dosed in mg/24 hours) patch 21 mg  21 mg Transdermal Daily Shuvon Rankin, NP   21 mg at 12/23/13 0800  . ondansetron (ZOFRAN) tablet 4 mg  4 mg Oral Q8H PRN Shuvon Rankin, NP      . QUEtiapine (SEROQUEL) tablet 100 mg  100 mg  Oral QHS Rachael FeeIrving A Ejay Lashley, MD   100 mg at 12/22/13 2238  . QUEtiapine (SEROQUEL) tablet 100 mg  100 mg Oral TID Rachael FeeIrving A Anaija Wissink, MD      . sertraline (ZOLOFT) tablet 50 mg  50 mg Oral Daily Craige CottaFernando A Cobos, MD   50 mg at 12/23/13 0813  . thiamine (VITAMIN B-1) tablet 100 mg  100 mg Oral Daily Shuvon Rankin, NP   100 mg at 12/23/13 0813  . traZODone (DESYREL) tablet 150 mg  150 mg Oral QHS PRN Sanjuana KavaAgnes I Nwoko, NP   150 mg at 12/22/13 2238   Facility-Administered Medications Ordered in Other Encounters  Medication Dose Route Frequency Provider Last Rate Last Dose  . HYDROmorphone (DILAUDID) injection 2 mg  2 mg Intravenous Once Angus SellerPeter S Dammen, PA-C        Lab Results: No results found for this or any previous visit (from the past 48 hour(s)).  Physical Findings: AIMS: Facial and Oral Movements Muscles of Facial Expression: None, normal Lips and Perioral Area: None, normal Jaw: None, normal Tongue: None, normal,Extremity Movements Upper (arms, wrists, hands, fingers): None, normal Lower (legs, knees, ankles, toes): None, normal, Trunk Movements Neck, shoulders, hips: None, normal, Overall Severity Severity of abnormal movements (highest score from questions above): None, normal Incapacitation due to abnormal movements: None, normal Patient's awareness of abnormal movements (rate only patient's report): No Awareness, Dental Status Current problems with teeth and/or dentures?: No Does patient usually wear dentures?: No  CIWA:  CIWA-Ar Total: 5 COWS:     Treatment Plan Summary: Daily contact with patient to assess and evaluate symptoms and progress in treatment Medication management  Plan: Supportive approach/coping skills/relapse prevention           CBT;mindfulness to deal with the anxiety/agitation            Increase the Seroquel to  100 mg QID add Neurontin 100 mg QID            Decrease the Librium to BID PRN Medical Decision Making Problem Points:  Established problem, worsening (2)  and Review of psycho-social stressors (1) Data Points:  Review of medication regiment & side effects (2) Review of new medications or change in dosage (2)  I certify that inpatient services furnished can reasonably be expected to improve the patient's condition.   Brigitt Mcclish A 12/23/2013, 4:49 PM

## 2013-12-23 NOTE — Progress Notes (Signed)
D: Pt presents with flat affect and depressed mood. Pt continues to request librium for withdrawal symptoms. Pt reports tremors, sweats and anxiety. Pt denies SI/HI/AVH. Pt reports feeling depressed 8/10. Pt has minimal interaction on on the unit today. Pt inconsistent with attending groups this morning. Pt reports tolerating her meds well and denies any adverse reactions.  A: Medications administered as ordered per MD. Verbal support given. Pt encouraged to attend groups. Prn med given as ordered per pt request. 15 minute checks performed for safety. R: Pt compliant with treatment plan.

## 2013-12-24 LAB — HCG, SERUM, QUALITATIVE: PREG SERUM: NEGATIVE

## 2013-12-24 MED ORDER — SERTRALINE HCL 100 MG PO TABS
100.0000 mg | ORAL_TABLET | Freq: Every day | ORAL | Status: DC
  Administered 2013-12-25 – 2013-12-26 (×2): 100 mg via ORAL
  Filled 2013-12-24: qty 14
  Filled 2013-12-24 (×3): qty 1

## 2013-12-24 MED ORDER — PNEUMOCOCCAL VAC POLYVALENT 25 MCG/0.5ML IJ INJ
0.5000 mL | INJECTION | INTRAMUSCULAR | Status: AC
  Administered 2013-12-25: 0.5 mL via INTRAMUSCULAR

## 2013-12-24 MED ORDER — GABAPENTIN 100 MG PO CAPS
200.0000 mg | ORAL_CAPSULE | Freq: Three times a day (TID) | ORAL | Status: DC
  Administered 2013-12-24 – 2013-12-26 (×8): 200 mg via ORAL
  Filled 2013-12-24: qty 2
  Filled 2013-12-24 (×3): qty 84
  Filled 2013-12-24 (×8): qty 2

## 2013-12-24 MED ORDER — MAGNESIUM CITRATE PO SOLN
1.0000 | Freq: Once | ORAL | Status: AC
Start: 2013-12-24 — End: 2013-12-24
  Administered 2013-12-24: 1 via ORAL

## 2013-12-24 MED ORDER — QUETIAPINE FUMARATE 100 MG PO TABS
100.0000 mg | ORAL_TABLET | Freq: Three times a day (TID) | ORAL | Status: DC
  Administered 2013-12-24 – 2013-12-26 (×8): 100 mg via ORAL
  Filled 2013-12-24 (×2): qty 1
  Filled 2013-12-24: qty 56
  Filled 2013-12-24: qty 1
  Filled 2013-12-24: qty 56
  Filled 2013-12-24 (×6): qty 1
  Filled 2013-12-24: qty 56
  Filled 2013-12-24: qty 1

## 2013-12-24 MED ORDER — INFLUENZA VAC SPLIT QUAD 0.5 ML IM SUSY
0.5000 mL | PREFILLED_SYRINGE | INTRAMUSCULAR | Status: AC
  Administered 2013-12-25: 0.5 mL via INTRAMUSCULAR
  Filled 2013-12-24: qty 0.5

## 2013-12-24 MED ORDER — ACAMPROSATE CALCIUM 333 MG PO TBEC
666.0000 mg | DELAYED_RELEASE_TABLET | Freq: Three times a day (TID) | ORAL | Status: DC
  Administered 2013-12-24 – 2013-12-26 (×8): 666 mg via ORAL
  Filled 2013-12-24: qty 2
  Filled 2013-12-24: qty 84
  Filled 2013-12-24 (×3): qty 2
  Filled 2013-12-24: qty 84
  Filled 2013-12-24 (×4): qty 2
  Filled 2013-12-24: qty 84
  Filled 2013-12-24 (×3): qty 2

## 2013-12-24 MED ORDER — QUETIAPINE FUMARATE 50 MG PO TABS: 50.0000 mg | ORAL_TABLET | Freq: Three times a day (TID) | ORAL | Status: DC

## 2013-12-24 NOTE — Progress Notes (Signed)
Pt attended spiritual care group on grief and loss facilitated by counseling intern Katelyn Bridges. Group opened with brief discussion and psycho-social ed around grief and loss in relationships and in relation to self - identifying life patterns, circumstances, changes that cause losses. Established group norm of speaking from own life experience. Group goal of establishing open and affirming space for members to share loss and experience with grief, normalize grief experience and provide psycho social education and grief support.   Katelyn Bridges was present and behaved appropriately throughout group. Katelyn Bridges did not engage in discussion until counselor prompted, but then shared she lost six loved ones in a short amount of time, including her 32 year old daughter. Another group member stated it must have been "devistating," and Katelyn Bridges agree. Counselor asked how she got through this time, and Katelyn Bridges responded with she was not sure but acknowledged she is seeking help now. Pt also agreed with perceptions of support from other members and lessened feelings of isolation [facility].   Katelyn Bridges Counseling Intern

## 2013-12-24 NOTE — Progress Notes (Signed)
Patient ID: Katelyn Bridges, female   DOB: Dec 31, 1980, 33 y.o.   MRN: [id number] [facility] MD Progress Note  12/24/2013 10:59 AM Katelyn Bridges  MRN:  [id number] Subjective:  Patient continues to report  Significant sense of free floating anxiety. Regarding withdrawal symptoms, she feels she " still has a little bit of withdrawal". She  States she does not want to go to an inpatient rehab setting after discharge, and feels she wants outpatient treatment after discharge. She is tolerating medications well and denies side effects. Objective: I have discussed case with treatment team and met with patient. Patient states she is tolerating medications well . At this time is not presenting with tremors, diaphoresis and does not appear to be in any acute distress or discomfort. She does report anxiety. Minimizes cravings for alcohol, but is expressing interest in Campral to help with efforts to maintain sobriety. We have discussed potential referral to an inpatient /residential rehab setting but she is not interested in this level of care. She states " I want to be home for the Holidays" Of note, mother has spoken with Education officer, museum and has expressed interest in a family meeting. Patient agrees and will tentatively set up a meeting with patient and mother - who is her closest support) for tomorrow.   Diagnosis:    Substance/Addictive Disorders:  Alcohol Related Disorder - Severe (303.90) and Opioid Disorder - Severe (304.00) Depressive Disorders:  Major Depressive Disorder - Moderate (296.22) Total Time spent with patient: 30 minutes  Axis I: Generalized Anxiety Disorder  ADL's:  Intact  Sleep: improved   Appetite: Improved   Psychiatric Specialty Exam: Physical Exam  Review of Systems  Constitutional: Negative.   HENT: Negative.   Eyes: Negative.   Respiratory: Negative.   Cardiovascular: Negative.   Gastrointestinal: Negative.   Genitourinary: Negative.    Musculoskeletal: Positive for myalgias.  Skin: Negative.   Neurological: Positive for tremors.  Endo/Heme/Allergies: Negative.   Psychiatric/Behavioral: Positive for depression and substance abuse. The patient is nervous/anxious and has insomnia.     Blood pressure 108/49, pulse 84, temperature 98.8 F (37.1 C), temperature source Oral, resp. rate 18, height 5' 2.5" (1.588 m), weight 66.679 kg (147 lb), last menstrual period 12/18/2013.Body mass index is 26.44 kg/(m^2).  General Appearance: Well Groomed  Engineer, water::  Good  Speech:  Clear and Coherent  Volume:  fluctuates  Mood:  Anxious and mood improved compared to admission  Affect:  Appropriate and reactive  Thought Process:  Coherent and Goal Directed  Orientation:  Full (Time, Place, and Person)  Thought Content:  no psychotic symptoms  Suicidal Thoughts:  No at this time denies any thoughts of hurting self and  contracts for safety on unit   Homicidal Thoughts:  No  Memory:  Recent and Remote grossly intact  Judgement:  Fair  Insight:  Present  Psychomotor Activity:  Normal and Restlessness  Concentration:  Good  Recall:  Good  Fund of Knowledge:NA  Language: Fair  Akathisia:  No  Handed:    AIMS (if indicated):     Assets:  Desire for Improvement  Sleep:  Number of Hours: 6.75   Musculoskeletal: Strength & Muscle Tone: within normal limits Gait & Station: normal Patient leans: N/A  Current Medications: Current Facility-Administered Medications  Medication Dose Route Frequency Provider Last Rate Last Dose  . acamprosate (CAMPRAL) tablet 666 mg  666 mg Oral TID WC Myer Peer Cobos, MD      . acetaminophen (TYLENOL) tablet 650  mg  650 mg Oral Q6H PRN Shuvon Rankin, NP      . alum & mag hydroxide-simeth (MAALOX/MYLANTA) 200-200-20 MG/5ML suspension 30 mL  30 mL Oral PRN Shuvon Rankin, NP      . chlordiazePOXIDE (LIBRIUM) capsule 25 mg  25 mg Oral TID PRN Nicholaus Bloom, MD   25 mg at 12/23/13 4585  . clobetasol  cream (TEMOVATE) 0.05 %   Topical BID Nicholaus Bloom, MD   1 application at 92/92/44 0800  . gabapentin (NEURONTIN) capsule 200 mg  200 mg Oral TID Jenne Campus, MD      . hydrOXYzine (ATARAX/VISTARIL) tablet 25 mg  25 mg Oral Q6H PRN Encarnacion Slates, NP   25 mg at 12/24/13 6286  . ibuprofen (ADVIL,MOTRIN) tablet 600 mg  600 mg Oral Q8H PRN Shuvon Rankin, NP      . [START ON 12/25/2013] Influenza vac split quadrivalent PF (FLUARIX) injection 0.5 mL  0.5 mL Intramuscular Tomorrow-1000 Nicholaus Bloom, MD      . magnesium hydroxide (MILK OF MAGNESIA) suspension 30 mL  30 mL Oral Daily PRN Shuvon Rankin, NP   30 mL at 12/22/13 1307  . multivitamin with minerals tablet 1 tablet  1 tablet Oral Daily Shuvon Rankin, NP   1 tablet at 12/24/13 0751  . nicotine (NICODERM CQ - dosed in mg/24 hours) patch 21 mg  21 mg Transdermal Daily Shuvon Rankin, NP   21 mg at 12/24/13 0800  . ondansetron (ZOFRAN) tablet 4 mg  4 mg Oral Q8H PRN Shuvon Rankin, NP      . [START ON 12/25/2013] pneumococcal 23 valent vaccine (PNU-IMMUNE) injection 0.5 mL  0.5 mL Intramuscular Tomorrow-1000 Nicholaus Bloom, MD      . QUEtiapine (SEROQUEL) tablet 100 mg  100 mg Oral TID Jenne Campus, MD      . Derrill Memo ON 12/25/2013] sertraline (ZOLOFT) tablet 100 mg  100 mg Oral Daily Myer Peer Cobos, MD      . thiamine (VITAMIN B-1) tablet 100 mg  100 mg Oral Daily Shuvon Rankin, NP   100 mg at 12/24/13 0751  . traZODone (DESYREL) tablet 150 mg  150 mg Oral QHS PRN Encarnacion Slates, NP   150 mg at 12/23/13 2222   Facility-Administered Medications Ordered in Other Encounters  Medication Dose Route Frequency Provider Last Rate Last Dose  . HYDROmorphone (DILAUDID) injection 2 mg  2 mg Intravenous Once Martie Lee, PA-C        Lab Results: No results found for this or any previous visit (from the past 48 hour(s)).  Physical Findings: AIMS: Facial and Oral Movements Muscles of Facial Expression: None, normal Lips and Perioral Area: None,  normal Jaw: None, normal Tongue: None, normal,Extremity Movements Upper (arms, wrists, hands, fingers): None, normal Lower (legs, knees, ankles, toes): None, normal, Trunk Movements Neck, shoulders, hips: None, normal, Overall Severity Severity of abnormal movements (highest score from questions above): None, normal Incapacitation due to abnormal movements: None, normal Patient's awareness of abnormal movements (rate only patient's report): No Awareness, Dental Status Current problems with teeth and/or dentures?: No Does patient usually wear dentures?: No  CIWA:  CIWA-Ar Total: 3 COWS:      Assessment_ Patient improved, but continues to report some mild subjective symptoms of WDL, although presents calm, with no tremors, no diaphoresis and stable vitals. She acknowledges improved mood, but remains vaguely anxious. Tolerating medications well. She is not interested in going to an inpatient rehab, which mother  has been encouraging her to do. She is agreeing to a family meeting with her mother and staff , which is being set up for tomorrow.   Treatment Plan Summary: Daily contact with patient to assess and evaluate symptoms and progress in treatment Medication management  Plan: Continue inpatient treatment and support            Seroquel 100 mgrs TID           Start Campral 666 mgrs TID           Zoloft 100 mgrs QDAY           Increase Neurontin to 200 mgrs TID           Librium PRNs for potential ongoing WDL symptoms           Family meeting tomorrow, with patient and mother.      Medical Decision Making Problem Points:  Established problem, worsening (2) and Review of psycho-social stressors (1) Data Points:  Review of medication regiment & side effects (2) Review of new medications or change in dosage (2)  I certify that inpatient services furnished can reasonably be expected to improve the patient's condition.   COBOS, FERNANDO 12/24/2013, 10:59 AM

## 2013-12-24 NOTE — Progress Notes (Signed)
[facility] Group Notes:  (Nursing/MHT/Case Management/Adjunct)  Date:  12/24/2013  Time:  2:27 AM  Type of Therapy:  Psychoeducational Skills  Participation Level:  Active  Participation Quality:  Appropriate  Affect:  Appropriate  Cognitive:  Appropriate  Insight:  Appropriate  Engagement in Group:  Engaged  Modes of Intervention:  Activity  Summary of Progress/Problems:  Katelyn Bridges C 12/24/2013, 2:27 AM 

## 2013-12-24 NOTE — Clinical Social Work Note (Signed)
CSW spoke with patient's mother Raelene BottDee Goodwin who is agreeable to family meeting on Wednesday 12/16 at 10:30 am.  As patient requested, CSW spoke with patient's employer Arline AspCindy to inform her that patient continues to be hospitalized.  Samuella BruinKristin Lorenda Grecco, MSW, [employer]LCSWA Clinical Social Worker [facility] [phone]

## 2013-12-24 NOTE — Tx Team (Addendum)
Interdisciplinary Treatment Plan Update (Adult) Date: 12/24/2013   Time Reviewed: 9:30 AM  Progress in Treatment: Attending groups: No Participating in groups: No Taking medication as prescribed: Yes Tolerating medication: Yes Family/Significant other contact made: Yes, CSW has spoken with patient's mother Patient understands diagnosis: Yes Discussing patient identified problems/goals with staff: Yes Medical problems stabilized or resolved: Yes Denies suicidal/homicidal ideation: Yes Issues/concerns per patient self-inventory: Yes Other:  New problem(s) identified: N/A  Discharge Plan or Barriers: CSW continuing to assess. Patient hopeful to return home with family and [facility]. Mother is reluctant to allow patient home if she does not complete residential treatment first.  Reason for Continuation of Hospitalization:  Depression Anxiety Medication Stabilization   Comments: N/A  Estimated length of stay: 1-2 days  For review of initial/current patient goals, please see plan of care. Patient is a 33 year old Caucasian female with a diagnosis of Alcohol use disorder, Moderate and Unspecified bipolar and related disorder. [place] a with her mother and step-father. Patient reports that she was IVC'd by her mother following an argument and that her mother "lied". Patient was calm and cooperative during assessment and reported that she knows that she needs to be here. She appeared lethargic and tearful. Patient reports regular alcohol use and depression, as well as difficulty adjusting to a recent break up and moving back home. Patient is unsure if she will be able to return home with her mother at discharge. [facility]. Patient will benefit from crisis stabilization, medication evaluation, group therapy, and psycho education in  addition to case management for discharge planning. Patient and CSW reviewed pt's identified goals and treatment plan. Patient identifies her goal as "to be happy." Pt verbalized understanding and agreed to treatment plan.  Attendees: Patient:    Family:    Physician: Dr. Jama Flavorsobos; Dr. Dub MikesLugo 12/24/2013 9:30 AM  Nursing: Harold Barbanonecia Byrd, Kathi SimpersSarah Twyman, [address]Pat[address] White, MabelBeverly Knight, RN 12/24/2013 9:30 AM  Clinical Social Worker: Samuella BruinKristin Wasim Hurlbut, LCSWA 12/24/2013 9:30 AM  Other: Juline PatchQuylle Hodnett, LCSW 12/24/2013 9:30 AM  Other: Leisa LenzValerie Enoch, Vesta [facility]er[facility] Liaison 12/24/2013 9:30 AM  Other: Serena ColonelAggie Nwoko, NP 12/24/2013 9:30 AM  Other: Onnie BoerJennifer Clark, Case Manager  12/24/2013 9:30 AM  Other: Everlene BallsShawn Taylor 12/24/2013 9:30 AM  Other:    Other:        Scribe for Treatment Team:  Samuella BruinKristin Donielle Radziewicz, MSW, LCSWA [phone]

## 2013-12-24 NOTE — [facility] Group Notes (Signed)
[facility] LCSW Group Therapy  12/24/2013   1:15 PM   Type of Therapy:  Group Therapy  Participation Level:  Active  Participation Quality:  Attentive, Sharing and Supportive  Affect:  Depressed and Flat  Cognitive:  Alert and Oriented  Insight:  Developing/Improving and Engaged  Engagement in Therapy:  Developing/Improving and Engaged  Modes of Intervention:  Clarification, Confrontation, Discussion, Education, Exploration, Limit-setting, Orientation, Problem-solving, Rapport Building, Dance movement psychotherapisteality Testing, Socialization and Support  Summary of Progress/Problems: The topic for group therapy was feelings about diagnosis.  Pt actively participated in group discussion on their past and current diagnosis and how they feel towards this.  Pt also identified how society and family members judge them, based on their diagnosis as well as stereotypes and stigmas.  Patient discussed events leading up to her medication noncompliance after her last admission and also discussed challenge of feeling misunderstood or not emotionally supported by loved ones. CSW's and other group members provided emotional support and encouragement.  Samuella BruinKristin Cheila Wickstrom, MSW, [employer]CSWA Clinical Social Worker [facility] [phone]

## 2013-12-24 NOTE — Progress Notes (Signed)
D: Pt presents anxious this morning. Pt reports feeling stressed out due to ongoing issues with her mother. Pt reports depression 9/10. Anxiety 10/10. Hopeless 6/10. Pt voiced concerns of not wanting to attend a long-term treatment facility. Pt [facility]. Pt denies SI/HI/AVH. Pt reports decreased withdrawal symptoms. No withdrawal symptoms observed by Clinical research associatewriter.  Pt reports that she slept well last night. Pt c/o constipation x days. Pt complaint with taking meds and attending groups.  A: Medications administered as ordered per MD. Verbal support given. Pt encouraged to attend groups. 15 minute checks performed for safety. Pt given prn vistaril this morning for anxiety at pts request.  R: Pt stated goal is to control her anxiety and not let people from the outside get her upset. Pt receptive to treatment.

## 2013-12-24 NOTE — Progress Notes (Signed)
Recreation Therapy Notes  Animal-Assisted Activity/Therapy (AAA/T) Program Checklist/Progress Notes Patient Eligibility Criteria Checklist & Daily Group note for Rec Tx Intervention  Date: 12.15.2015 Time: 2:45pm Location: [address] Dayroom    AAA/T Program Assumption of Risk Form signed by Patient/ or Parent Legal Guardian yes  Patient is free of allergies or sever asthma yes  Patient reports no fear of animals yes  Patient reports no history of cruelty to animals yes  Patient understands his/her participation is voluntary yes  Patient washes hands before animal contact yes  Patient washes hands after animal contact yes  Behavioral Response: Appropriate   Education: Hand Washing, Appropriate Animal Interaction   Education Outcome: Acknowledges education.   Clinical Observations/Feedback: Upon seeing therapy dog patient stated she did not want to participate, as she has recently lost her dog of 14 years. Patient left session, however returned approximately 15 minutes later and actively engaged with therapy dog.    Marykay Lexenise L Leland Staszewski, LRT/CTRS  Jearl KlinefelterBlanchfield, Darcell Yacoub L 12/24/2013 4:33 PM

## 2013-12-24 NOTE — Progress Notes (Signed)
D   Pt is anxious and endorses some depression   She reports problems with constipation and said she has taken mom several times and requested it again    She interacts appropriately with select peers   Pt did say she received some relief from the mag citrate   She expressed confusion over the change of her medication seroquel and agreed to talk to the doctor about it tomorrow as she would like to take a dose at night A    Verbal support given   Medications administered and effectiveness monitored    Encouraged pt to tell her doctor of the nurse practioner about her constipation and gave her another dose of mom  Q 15 min checks  Encouraged pt to talk with doctor about changing the dosing times of her seroquel R   Pt safe at present  Pt verbalized understanding and agreed to talk with her doctor

## 2013-12-24 NOTE — [facility] Group Notes (Signed)
LATE ENTRY FROM 12/24/14.      [facility] LCSW Group Therapy 12/23/13  1:15 PM   Type of Therapy: Group Therapy  Participation Level: Did Not Attend. Patient asleep in bed, invited to attend but declined.   Samuella BruinKristin Teiara Baria, MSW, [employer] Clinical Social Worker [facility] [phone] '

## 2013-12-24 NOTE — Progress Notes (Signed)
D   Pt is anxious and endorses some depression   She reports problems with constipation and said she has taken mom several times and requested it again    She interacts appropriately with select peers  A    Verbal support given   Medications administered and effectiveness monitored    Encouraged pt to tell her doctor of the nurse practioner about her constipation and gave her another dose of mom  Q 15 min checks R   Pt safe at present

## 2013-12-25 LAB — HEPATITIS B SURFACE ANTIGEN: HEP B S AG: NEGATIVE

## 2013-12-25 LAB — HEPATITIS C ANTIBODY: HCV AB: NEGATIVE

## 2013-12-25 LAB — HIV ANTIBODY (ROUTINE TESTING W REFLEX): HIV: NONREACTIVE

## 2013-12-25 MED ORDER — QUETIAPINE FUMARATE 100 MG PO TABS
100.0000 mg | ORAL_TABLET | Freq: Every day | ORAL | Status: DC
  Administered 2013-12-25: 100 mg via ORAL
  Filled 2013-12-25 (×4): qty 1

## 2013-12-25 NOTE — [facility] Group Notes (Signed)
Adult Psychoeducational Group Note  Date:  12/25/2013 Time:  5:10 AM  Group Topic/Focus:  Wrap-up Group  Participation Level:  Active  Participation Quality:  Appropriate  Affect:  Appropriate  Cognitive:  Appropriate  Insight: Appropriate  Engagement in Group:  Limited  Modes of Intervention:  Discussion  Additional Comments: Pt entered group at the very end.  Once asked how she would describe her day stated, "It was an okay day. It was interesting and that's all I can say".  When asked if she wanted to elaborate on what "interesting" meant pt laughed and said she would leave it at that.  Tomi BambergerChestnut, Kalven Ganim Coursey 12/25/2013, 5:10 AM

## 2013-12-25 NOTE — Progress Notes (Signed)
D: Pt presents anxious this morning. Pt fidgety and worried on approach. Pt voiced feeling anxious about her family meeting this morning. Pt stated that she would like to f/u with IOP instead of going to a long term tx facility. Pt denies having any withdrawal symptoms this morning. Pt reported poor sleep last night due to not having Seroquel scheduled at bedtime. Pt denies SI/HI/AVH. Pt reports decreasing depression today. Pt compliant with taking meds and attending groups. A: Medications administered as ordered per MD. MD made aware of pt c/o of poor sleep. Verbal support given. Pt encouraged to attend groups. 15 minute checks performed for safety. R: Pt receptive to treatment.

## 2013-12-25 NOTE — Progress Notes (Addendum)
Patient ID: Katelyn Bridges, female   DOB: March 16, 1980, 33 y.o.   MRN: [id number] [facility] MD Progress Note  12/25/2013 12:18 PM [name]  MRN:  [id number] Subjective:  Reports feeling  Anxious about upcoming discharge, but acknowledges improvement compared to admission. States she is tolerating medications well, including Campral, which was recently added to help address alcohol cravings/decrease risk of relapse. Reports some increased insomnia and states preferred prior Seroquel dose ( 400 mgrs QHS total daily dose), as it helped her sleep better.   Objective: I have discussed case with treatment team and met with patient. Family Meeting was held earlier today with SW , patient's mother, patient. Report is that mother is supportive and invested in patient's improvement. Patient plans to return to live with family, and states she will soon be moving into her own place near where her mother and sister live. Patient is tolerating medications well- we discussed side effect profiles, to include risk of movement disorder, weight gain, metabolic disturbances on Seroquel. Of note, patient states she feels Seroquel has been quite effective in decreasing her short lived mood swings and mood instability and in decreasing overall anxiety. No akathisia, no abnormal involuntary movements noted. Behavior on unit in good control- going to groups.  We reviewed labs as below  : patient had  Requested  to be tested for pregnancy due to irregular menses, and for  HIV and Viral Hepatitis based on concern that her ex boyfriend had been unfaithful /promiscuous.     Diagnosis:    Substance/Addictive Disorders:  Alcohol Related Disorder - Severe (303.90) and Opioid Disorder - Severe (304.00) Depressive Disorders:  Major Depressive Disorder - Moderate (296.22) Total Time spent with patient: 30 minutes  Axis I: Generalized Anxiety Disorder  ADL's:  Intact  Sleep: improved   Appetite: Improved    Psychiatric Specialty Exam: Physical Exam  Review of Systems  Constitutional: Negative.   HENT: Negative.   Eyes: Negative.   Respiratory: Negative.   Cardiovascular: Negative.   Gastrointestinal: Negative.   Genitourinary: Negative.   Musculoskeletal: Positive for myalgias.  Skin: Negative.   Neurological: Positive for tremors.  Endo/Heme/Allergies: Negative.   Psychiatric/Behavioral: Positive for depression and substance abuse. The patient is nervous/anxious and has insomnia.     Blood pressure 109/78, pulse 76, temperature 98.1 F (36.7 C), temperature source Oral, resp. rate 16, height 5' 2.5" (1.588 m), weight 66.679 kg (147 lb), last menstrual period 12/18/2013.Body mass index is 26.44 kg/(m^2).  General Appearance: Well Groomed  Engineer, water::  Good  Speech:  Clear and Coherent  Volume:  fluctuates  Mood:  remains subjectively anxious  Affect:  Appropriate and reactive  Thought Process:  Coherent and Goal Directed  Orientation:  Full (Time, Place, and Person)  Thought Content:  no psychotic symptoms  Suicidal Thoughts:  No at this time denies any thoughts of hurting self and  contracts for safety on unit   Homicidal Thoughts:  No  Memory:  Recent and Remote grossly intact  Judgement:  Fair  Insight:  Present  Psychomotor Activity:  Normal  Concentration:  Good  Recall:  Good  Fund of Knowledge:NA  Language: Fair  Akathisia:  No  Handed:    AIMS (if indicated):     Assets:  Desire for Improvement  Sleep:  Number of Hours: 6.75   Musculoskeletal: Strength & Muscle Tone: within normal limits Gait & Station: normal Patient leans: N/A  Current Medications: Current Facility-Administered Medications  Medication Dose Route Frequency Provider Last  Rate Last Dose  . acamprosate (CAMPRAL) tablet 666 mg  666 mg Oral TID WC Jenne Campus, MD   666 mg at 12/25/13 1158  . acetaminophen (TYLENOL) tablet 650 mg  650 mg Oral Q6H PRN Shuvon Rankin, NP      . alum & mag  hydroxide-simeth (MAALOX/MYLANTA) 200-200-20 MG/5ML suspension 30 mL  30 mL Oral PRN Shuvon Rankin, NP      . chlordiazePOXIDE (LIBRIUM) capsule 25 mg  25 mg Oral TID PRN Nicholaus Bloom, MD   25 mg at 12/24/13 2211  . clobetasol cream (TEMOVATE) 0.05 %   Topical BID Nicholaus Bloom, MD      . gabapentin (NEURONTIN) capsule 200 mg  200 mg Oral TID Jenne Campus, MD   200 mg at 12/25/13 1158  . hydrOXYzine (ATARAX/VISTARIL) tablet 25 mg  25 mg Oral Q6H PRN Encarnacion Slates, NP   25 mg at 12/24/13 7782  . ibuprofen (ADVIL,MOTRIN) tablet 600 mg  600 mg Oral Q8H PRN Shuvon Rankin, NP      . Influenza vac split quadrivalent PF (FLUARIX) injection 0.5 mL  0.5 mL Intramuscular Tomorrow-1000 Nicholaus Bloom, MD      . magnesium hydroxide (MILK OF MAGNESIA) suspension 30 mL  30 mL Oral Daily PRN Shuvon Rankin, NP   30 mL at 12/22/13 1307  . multivitamin with minerals tablet 1 tablet  1 tablet Oral Daily Shuvon Rankin, NP   1 tablet at 12/25/13 0806  . nicotine (NICODERM CQ - dosed in mg/24 hours) patch 21 mg  21 mg Transdermal Daily Shuvon Rankin, NP   21 mg at 12/25/13 0800  . ondansetron (ZOFRAN) tablet 4 mg  4 mg Oral Q8H PRN Shuvon Rankin, NP      . pneumococcal 23 valent vaccine (PNU-IMMUNE) injection 0.5 mL  0.5 mL Intramuscular Tomorrow-1000 Nicholaus Bloom, MD      . QUEtiapine (SEROQUEL) tablet 100 mg  100 mg Oral TID Jenne Campus, MD   100 mg at 12/25/13 1158  . QUEtiapine (SEROQUEL) tablet 100 mg  100 mg Oral QHS Nicholaus Bloom, MD      . sertraline (ZOLOFT) tablet 100 mg  100 mg Oral Daily Jenne Campus, MD   100 mg at 12/25/13 0806  . thiamine (VITAMIN B-1) tablet 100 mg  100 mg Oral Daily Shuvon Rankin, NP   100 mg at 12/25/13 0800  . traZODone (DESYREL) tablet 150 mg  150 mg Oral QHS PRN Encarnacion Slates, NP   150 mg at 12/24/13 2209   Facility-Administered Medications Ordered in Other Encounters  Medication Dose Route Frequency Provider Last Rate Last Dose  . HYDROmorphone (DILAUDID) injection  2 mg  2 mg Intravenous Once Martie Lee, PA-C        Lab Results:  Results for orders placed or performed during the hospital encounter of 12/18/13 (from the past 48 hour(s))  hCG, serum, qualitative     Status: None   Collection Time: 12/24/13  7:21 PM  Result Value Ref Range   Preg, Serum NEGATIVE NEGATIVE    Comment:        THE SENSITIVITY OF THIS METHODOLOGY IS >10 mIU/mL. Performed at [facility]   Hepatitis B surface antigen     Status: None   Collection Time: 12/24/13  7:21 PM  Result Value Ref Range   Hepatitis B Surface Ag NEGATIVE NEGATIVE    Comment: Performed at [facility]  Hepatitis C  antibody     Status: None   Collection Time: 12/24/13  7:21 PM  Result Value Ref Range   HCV Ab NEGATIVE NEGATIVE    Comment: Performed at [facility]  HIV antibody     Status: None   Collection Time: 12/24/13  7:21 PM  Result Value Ref Range   HIV 1&2 Ab, 4th Generation NONREACTIVE NONREACTIVE    Comment: (NOTE) A NONREACTIVE HIV Ag/Ab result does not exclude HIV infection since the time frame for seroconversion is variable. If acute HIV infection is suspected, a HIV-1 RNA Qualitative TMA test is recommended. HIV-1/2 Antibody Diff         Not indicated. HIV-1 RNA, Qual TMA           Not indicated. PLEASE NOTE: This information has been disclosed to you from records whose confidentiality may be protected by state law. If your state requires such protection, then the state law prohibits you from making any further disclosure of the information without the specific written consent of the person to whom it pertains, or as otherwise permitted by law. A general authorization for the release of medical or other information is NOT sufficient for this purpose. The performance of this assay has not been clinically validated in patients less than 70 years old. Performed at [facility]     Physical Findings: AIMS: Facial and Oral  Movements Muscles of Facial Expression: None, normal Lips and Perioral Area: None, normal Jaw: None, normal Tongue: None, normal,Extremity Movements Upper (arms, wrists, hands, fingers): None, normal Lower (legs, knees, ankles, toes): None, normal, Trunk Movements Neck, shoulders, hips: None, normal, Overall Severity Severity of abnormal movements (highest score from questions above): None, normal Incapacitation due to abnormal movements: None, normal Patient's awareness of abnormal movements (rate only patient's report): No Awareness, Dental Status Current problems with teeth and/or dentures?: No Does patient usually wear dentures?: No  CIWA:  CIWA-Ar Total: 2 COWS:      Assessment_ Patient improved but remains subjectively anxious. She is tolerating medications well and states Seroquel has decreased mood instability/ short lived mood swings she had been experiencing. Family meeting with mother went well.    Treatment Plan Summary: Daily contact with patient to assess and evaluate symptoms and progress in treatment Medication management  Plan: Continue inpatient treatment and support           Consider discharge soon as patient continues to stabilize.            Increase Seroquel 100 mgrs QID           Campral 666 mgrs TID           Zoloft 100 mgrs QDAY           Neurontin to 200 mgrs TID           Will order Hgb A1C and Lipid Panel- routine as on Seroquel               Medical Decision Making Problem Points:  Established problem, worsening (2) and Review of psycho-social stressors (1) Data Points:  Review of medication regiment & side effects (2) Review of new medications or change in dosage (2)  I certify that inpatient services furnished can reasonably be expected to improve the patient's condition.   COBOS, [place] 12/25/2013, 12:18 PM

## 2013-12-25 NOTE — [facility] Group Notes (Signed)
[facility] LCSW Group Therapy 12/25/2013  1:15 PM   Type of Therapy: Group Therapy  Participation Level: Did Not Attend. Patient in bed asleep, invited to attend group but declined.   Samuella BruinKristin Anishka Bushard, MSW, [employer] Clinical Social Worker [facility] [phone]

## 2013-12-25 NOTE — Clinical Social Work Note (Signed)
CSW met with patient, mother, and step father to discuss patient's progress in treatment and discharge plans. Patient and parents discussed their concerns and needs. Patient is able to return home with parents to [facility] [place] [facility] services. CSW provided emotional support and encouragement.   Tilden Fossa, MSW, [place] Worker [facility] [phone]

## 2013-12-25 NOTE — [facility] Group Notes (Signed)
   [facility]  12/25/2013  8:45 AM   Participation Quality: Alert, Appropriate and Oriented  Mood/Affect: Depressed and Flat  Depression Rating: 7  Anxiety Rating: 10  Thoughts of Suicide: Pt denies SI/HI  Will you contract for safety? Yes  Current AVH: Pt denies  Plan for Discharge/Comments: Pt attended discharge planning group and actively participated in group. CSW provided pt with today's workbook. Patient plans to return home with her parents [facility]. Patient reported feeling anxious about discharging home and family meeting scheduled for today.  Transportation Means: Pt reports access to transportation  Supports: Patient has identified her parents as being supportive.  Samuella BruinKristin Donovan Gatchel, MSW, [employer] Clinical Social Worker [facility] [phone]

## 2013-12-26 DIAGNOSIS — F1920 Other psychoactive substance dependence, uncomplicated: Secondary | ICD-10-CM

## 2013-12-26 LAB — HEMOGLOBIN A1C
HEMOGLOBIN A1C: 5.7 % — AB (ref <5.7)
Mean Plasma Glucose: 117 mg/dL — ABNORMAL HIGH (ref <117)

## 2013-12-26 LAB — LIPID PANEL
Cholesterol: 203 mg/dL — ABNORMAL HIGH (ref 0–200)
HDL: 67 mg/dL (ref >39)
LDL Cholesterol: 88 mg/dL (ref 0–99)
Total CHOL/HDL Ratio: 3 RATIO
Triglycerides: 238 mg/dL — ABNORMAL HIGH (ref <150)
VLDL: 48 mg/dL — ABNORMAL HIGH (ref 0–40)

## 2013-12-26 MED ORDER — ACAMPROSATE CALCIUM 333 MG PO TBEC: 666.0000 mg | DELAYED_RELEASE_TABLET | Freq: Three times a day (TID) | ORAL | Status: DC

## 2013-12-26 MED ORDER — TRAZODONE HCL 150 MG PO TABS: 150.0000 mg | ORAL_TABLET | Freq: Every evening | ORAL | Status: DC | PRN

## 2013-12-26 MED ORDER — SERTRALINE HCL 100 MG PO TABS: 100.0000 mg | ORAL_TABLET | Freq: Every day | ORAL | Status: AC

## 2013-12-26 MED ORDER — HYDROXYZINE HCL 25 MG PO TABS: 25.0000 mg | ORAL_TABLET | Freq: Four times a day (QID) | ORAL | Status: DC | PRN

## 2013-12-26 MED ORDER — GABAPENTIN 100 MG PO CAPS: 200.0000 mg | ORAL_CAPSULE | Freq: Three times a day (TID) | ORAL | Status: DC

## 2013-12-26 MED ORDER — QUETIAPINE FUMARATE 100 MG PO TABS: ORAL_TABLET | ORAL | Status: DC

## 2013-12-26 MED ORDER — CLOBETASOL PROPIONATE 0.05 % EX CREA: TOPICAL_CREAM | Freq: Two times a day (BID) | CUTANEOUS | Status: DC

## 2013-12-26 NOTE — Discharge Summary (Signed)
Physician Discharge Summary Note  Patient:  Katelyn Bridges is an 33 y.o., female MRN:  [id number] DOB:  [birth year] Patient phone:  [phone] (home)  Patient address:   [address] [place] [address],  Total Time spent with patient: Greater than 30 minutes  Date of Admission:  12/18/2013 Date of Discharge: 12/25/13  Reason for Admission: Alcohol detoxification treatment.  Discharge Diagnoses: Active Problems:   Polysubstance dependence including opioid type drug, episodic abuse   Alcohol dependence with alcohol-induced mood disorder   Psychiatric Specialty Exam: Physical Exam  Eyes: Left eye exhibits no discharge.  Psychiatric: Her speech is normal and behavior is normal. Judgment and thought content normal. Her mood appears not anxious. Her affect is not angry, not blunt, not labile and not inappropriate. Cognition and memory are normal. She does not exhibit a depressed mood.    Review of Systems  Constitutional: Negative.   HENT: Negative.   Eyes: Negative.   Respiratory: Negative.   Cardiovascular: Negative.   Gastrointestinal: Negative.   Genitourinary: Negative.   Musculoskeletal: Negative.   Skin: Negative.   Neurological: Negative.   Endo/Heme/Allergies: Negative.   Psychiatric/Behavioral: Positive for depression (Stable) and substance abuse (Polysusbstance dependence). Negative for suicidal ideas, hallucinations and memory loss. The patient has insomnia (Stable). The patient is not nervous/anxious.     Blood pressure 117/69, pulse 67, temperature 98 F (36.7 C), temperature source Oral, resp. rate 16, height 5' 2.5" (1.588 m), weight 66.679 kg (147 lb), last menstrual period 12/18/2013.Body mass index is 26.44 kg/(m^2).  See Md's SRA                                                 Past Psychiatric History: Yes Diagnosis: History of Depression and history of Alcohol Dependence. Denies mania, denies OCD, denies PTSD. Some  panic attacks but no agoraphobia described  Hospitalizations: Denies   Outpatient Care: Was referred to [facility] after last admission but did not regularly follow up   Substance Abuse Care: None recent  Self-Mutilation: Denies cutting or self injurious behaviors  Suicidal Attempts: Denies any history of suicide attempts  Violent Behaviors: Denies   Musculoskeletal: Strength & Muscle Tone: within normal limits Gait & Station: normal Patient leans: N/A  DSM5: Schizophrenia Disorders:  NA Obsessive-Compulsive Disorders:  NA Trauma-Stressor Disorders:  NA Substance/Addictive Disorders:  Alcohol Related Disorder - Severe (303.90) Depressive Disorders:  NA  Axis Diagnosis:  AXIS I:  Polysubstance dependence AXIS II:  Deferred AXIS III:   Past Medical History  Diagnosis Date  . ANXIETY 05/20/2009  . DEPRESSION/ANXIETY 03/24/2008  . DEPRESSION 05/20/2009  . INSOMNIA-SLEEP DISORDER-UNSPEC 03/24/2008  . PSORIASIS 03/24/2008  . SKIN RASH 08/13/2009  . Seizure    AXIS IV:  other psychosocial or environmental problems and Polysubstance dependence AXIS V:  63  Level of Care:  OP  Hospital Course:  Katelyn Bridges is a 33 y.o. single, Caucasian female who presents to [facility] via IVC. Pt's mother placed pt under IVC due to concern over her drug and alcohol abuse and depression. IVC papers state that pt made threats last night to take pills and "just end it all" in addiction to her excessive alcohol and drug use last night. Pt adamantly denies any SI/HI or A/VH. She presents with irritable mood, stating that she is angry that her mother got her placed  in the hospital "with no proof at all" that she is suicidal. She admits to relapsing on cocaine and marijuana after going to a birthday party two nights ago.   Katelyn Bridges was admitted to the hospital with her toxicology tests result showing a BAL of 243 and UDS test results  positive for THC and Cocaine. She was also threatening  suicide by overdose. She was in need of alcohol/drug detox and mood stabilization treatments. Katelyn Bridges received Librium detoxification treatment protocols for alcohol detox. She was also medicated and discharged on; Accomprosate 666 mg twice daily for alcoholism, Seroquel 100 mg qid for mood control, Sertraline 100 mg daily for depression, Hydroxyzine 25 mg qid for anxiety and Trazodone 150 mg q bedtime for sleep. Katelyn Bridges was also enrolled & participated in the group counseling sessions being offered and held on this unit. She learned coping skills. She tolerated her treatment regimen without any significant adverse and or reactions.  Katelyn Bridges has completed detox treatments and her mood is now stable. This evidenced by her reports of improved mood, absence of substance withdrawal symptoms & suicidal ideations. She is [facility] being [facility]. She is provided with all the pertinent information required to make this appointment without problems. Upon discharge, Katelyn Bridges adamantly denies any SIHI, AVH, delusions and or paranoia. [facility], a 14 days worth, supply [facility]. [facility] with all personal belongings in no apparent distress. Transportation per parents.  Consults:  psychiatry  Significant Diagnostic Studies:  labs: CBC with diff, CMP, UDS, toxicology tests, U/A  Discharge Vitals:   Blood pressure 117/69, pulse 67, temperature 98 F (36.7 C), temperature source Oral, resp. rate 16, height 5' 2.5" (1.588 m), weight 66.679 kg (147 lb), last menstrual period 12/18/2013. Body mass index is 26.44 kg/(m^2). Lab Results:   Results for orders placed or performed during the hospital encounter of 12/18/13 (from the past 72 hour(s))  hCG, serum, qualitative     Status: None   Collection Time: 12/24/13  7:21 PM  Result Value Ref Range   Preg, Serum NEGATIVE NEGATIVE    Comment:         THE SENSITIVITY OF THIS METHODOLOGY IS >10 mIU/mL. Performed at [facility]   Hepatitis B surface antigen     Status: None   Collection Time: 12/24/13  7:21 PM  Result Value Ref Range   Hepatitis B Surface Ag NEGATIVE NEGATIVE    Comment: Performed at [facility]  Hepatitis C antibody     Status: None   Collection Time: 12/24/13  7:21 PM  Result Value Ref Range   HCV Ab NEGATIVE NEGATIVE    Comment: Performed at [facility]  HIV antibody     Status: None   Collection Time: 12/24/13  7:21 PM  Result Value Ref Range   HIV 1&2 Ab, 4th Generation NONREACTIVE NONREACTIVE    Comment: (NOTE) A NONREACTIVE HIV Ag/Ab result does not exclude HIV infection since the time frame for seroconversion is variable. If acute HIV infection is suspected, a HIV-1 RNA Qualitative TMA test is recommended. HIV-1/2 Antibody Diff         Not indicated. HIV-1 RNA, Qual TMA           Not indicated. PLEASE NOTE: This information has been disclosed to you from records whose confidentiality may be protected by state law. If your state requires such protection, then the  state law prohibits you from making any further disclosure of the information without the specific written consent of the person to whom it pertains, or as otherwise permitted by law. A general authorization for the release of medical or other information is NOT sufficient for this purpose. The performance of this assay has not been clinically validated in patients less than 127 years old. Performed at [facility]   Lipid panel     Status: Abnormal   Collection Time: 12/26/13  6:29 AM  Result Value Ref Range   Cholesterol 203 (H) 0 - 200 mg/dL   Triglycerides 161238 (H) <150 mg/dL   HDL 67 >09>39 mg/dL   Total CHOL/HDL Ratio 3.0 RATIO   VLDL 48 (H) 0 - 40 mg/dL   LDL Cholesterol 88 0 - 99 mg/dL    Comment:        Total Cholesterol/HDL:CHD Risk Coronary Heart Disease Risk Table                      Men   Women  1/2 Average Risk   3.4   3.3  Average Risk       5.0   4.4  2 X Average Risk   9.6   7.1  3 X Average Risk  23.4   11.0        Use the calculated Patient Ratio above and the CHD Risk Table to determine the patient's CHD Risk.        ATP III CLASSIFICATION (LDL):  <100     mg/dL   Optimal  [id number]  mg/dL   Near or Above                    Optimal  130-159  mg/dL   Borderline  [id number]  mg/dL   High  >478>190     mg/dL   Very High Performed at [facility]     Physical Findings: AIMS: Facial and Oral Movements Muscles of Facial Expression: None, normal Lips and Perioral Area: None, normal Jaw: None, normal Tongue: None, normal,Extremity Movements Upper (arms, wrists, hands, fingers): None, normal Lower (legs, knees, ankles, toes): None, normal, Trunk Movements Neck, shoulders, hips: None, normal, Overall Severity Severity of abnormal movements (highest score from questions above): None, normal Incapacitation due to abnormal movements: None, normal Patient's awareness of abnormal movements (rate only patient's report): No Awareness, Dental Status Current problems with teeth and/or dentures?: No Does patient usually wear dentures?: No  CIWA:  CIWA-Ar Total: 1 COWS:  COWS Total Score: 1  Psychiatric Specialty Exam: See Psychiatric Specialty Exam and Suicide Risk Assessment completed by Attending Physician prior to discharge.  Discharge destination:  Home  Is patient on multiple antipsychotic therapies at discharge:  No   Has Patient had three or more failed trials of antipsychotic monotherapy by history:  No  Recommended Plan for Multiple Antipsychotic Therapies: NA    Medication List    TAKE these medications      Indication   acamprosate 333 MG tablet  Commonly known as:  CAMPRAL  Take 2 tablets (666 mg total) by mouth 3 (three) times daily with meals. For alcoholism   Indication:  Excessive Use of Alcohol     clobetasol cream 0.05 %  Commonly  known as:  TEMOVATE  Apply topically 2 (two) times daily. For skin rashes   Indication:  Itching, Rashes     gabapentin 100 MG capsule  Commonly known as:  NEURONTIN  Take 2 capsules (200 mg total) by mouth 3 (three) times daily. For agitation/substance withdrawal syndrome   Indication:  Agitation, Alcohol Withdrawal Syndrome     hydrOXYzine 25 MG tablet  Commonly known as:  ATARAX/VISTARIL  Take 1 tablet (25 mg total) by mouth every 6 (six) hours as needed for anxiety.   Indication:  Tension, Tension     QUEtiapine 100 MG tablet  Commonly known as:  SEROQUEL  Take 1 tablet (100 mg) four times daily for mood control   Indication:  Mood control     sertraline 100 MG tablet  Commonly known as:  ZOLOFT  Take 1 tablet (100 mg total) by mouth daily. For depression   Indication:  Major Depressive Disorder     traZODone 150 MG tablet  Commonly known as:  DESYREL  Take 1 tablet (150 mg total) by mouth at bedtime as needed for sleep.   Indication:  Trouble Sleeping       Follow-up Information    Follow [facility] with [facility] On 12/31/2013.   Specialty:  Professional Counselor   Why:  Therapy appointment on Tuesday Dec. 22 at 4 pm with Magda Paganini. Please call office if you need to reschedule appointment.   Contact information:   [facility] [place] [address] [place] [address] [phone]      Follow-up recommendations: Activity:  As tolerated Diet: As recommended by your primary care doctor. Keep all scheduled follow-up appointments as recommended.   Comments: Take all your medications as prescribed by your mental healthcare provider. Report any adverse effects and or reactions from your medicines to your outpatient provider promptly. Patient is instructed and cautioned to not engage in alcohol and or illegal drug use while on prescription medicines. In the event of worsening symptoms, patient is instructed to call the crisis  hotline, 911 and or go to the nearest ED for appropriate evaluation and treatment of symptoms. Follow-up with your primary care provider for your other medical issues, concerns and or health care needs.   Total Discharge Time:  Greater than 30 minutes.  SignedSanjuana Kava, PMHNP-BC 12/26/2013, 11:03 AM   Patient seen, Suicide Assessment Completed.  Disposition Plan Reviewed

## 2013-12-26 NOTE — [facility] Suicide Risk Assessment (Signed)
Demographic Factors:  33 year old single female, no children  Total Time spent with patient: 30 minutes  Psychiatric Specialty Exam: Physical Exam  ROS  Blood pressure 108/69, pulse 72, temperature 98 F (36.7 C), temperature source Oral, resp. rate 16, height 5' 2.5" (1.588 m), weight 66.679 kg (147 lb), last menstrual period 12/18/2013.Body mass index is 26.44 kg/(m^2).  General Appearance: Well Groomed  Patent attorneyye Contact::  Good  Speech:  Normal Rate  Volume:  Normal  Mood:  Euthymic  Affect:  Full Range  Thought Process:  Goal Directed and Linear  Orientation:  Full (Time, Place, and Person)  Thought Content:  denies any hallucinations, no delusions  Suicidal Thoughts:  No  Homicidal Thoughts:  No  Memory:  Recent and Remote grossly intact  Judgement:  Other:  improved  Insight:  improved   Psychomotor Activity:  Normal  Concentration:  Good  Recall:  Good  Fund of Knowledge:Good  Language: Good  Akathisia:  Negative  Handed:  Right  AIMS (if indicated):     Assets:  Communication Skills Desire for Improvement Resilience Social Support  Sleep:  Number of Hours: 6.25    Musculoskeletal: Strength & Muscle Tone: within normal limits Gait & Station: normal Patient leans: N/A   Mental Status Per Nursing Assessment::   On Admission:     Current Mental Status by Physician: As noted above, at this time patient is improved, euthymic, with full range of affect, no thought disorder, no SI or HI, no psychotic symptoms, future oriented.   Loss Factors: History of DUIs, family relationships strained due to substance/alcohol abuse   Historical Factors: History of depression, history of alcohol abuse. Second psychiatric admission  Risk Reduction Factors:   Sense of responsibility to family, Living with another person, especially a relative and Positive coping skills or problem solving skills  Continued Clinical Symptoms:  At this time patient much improved, euthymic,  brighter affect, less anxious, no thought disorder, not suicidal or homicidal , not psychotic, future oriented  Cognitive Features That Contribute To Risk:  No gross cognitive deficits noted upon discharge. Is alert , attentive, and oriented x 3   Suicide Risk:  Mild:  Suicidal ideation of limited frequency, intensity, duration, and specificity.  There are no identifiable plans, no associated intent, mild dysphoria and related symptoms, good self-control (both objective and subjective assessment), few other risk factors, and identifiable protective factors, including available and accessible social support.  Discharge Diagnoses:   AXIS I:  Alcohol Dependence, Alcohol Induced Mood Disorder, consider Bipolar Disorder, Depressed  AXIS II:  Deferred AXIS III:   Past Medical History  Diagnosis Date  . ANXIETY 05/20/2009  . DEPRESSION/ANXIETY 03/24/2008  . DEPRESSION 05/20/2009  . INSOMNIA-SLEEP DISORDER-UNSPEC 03/24/2008  . PSORIASIS 03/24/2008  . SKIN RASH 08/13/2009  . Seizure    AXIS IV:  problems related to legal system/crime and problems related to social environment AXIS V:  65-70 upon discharge   Plan Of Care/Follow-up recommendations:  Activity:  As tolerated Diet:  Low cholesterol Tests:  NA Other:  See below  Is patient on multiple antipsychotic therapies at discharge:  No   Has Patient had three or more failed trials of antipsychotic monotherapy by history:  No  Recommended Plan for Multiple Antipsychotic Therapies: NA  Patient is leaving unit in good spirits. She is planning to live with mother for the time being. Plans to go to AA regularly.  She plans to follow up as below [facility].    Specialty: Professional Counselor   Why: Therapy appointment on Tuesday Dec. 22 at 4 pm with Magda PaganiniAudrey. Please call office if you need to reschedule appointment.   Contact information:   [facility] [address] [place] [address] [phone]   Patient plans to follow up with Dr. Jonny RuizJohn , her PCP for medical issues as needed. We discussed lab results, including mild hypertriglyceridemia.    Jasey Cortez 12/26/2013, 9:54 AM

## 2013-12-26 NOTE — Progress Notes (Signed)
[facility] Discharge [facility] :  Will you be returning to the same living situation after discharge: Yes,  patient will return home with her parents At discharge, do you have transportation home?:Yes,  patient's parents will provide transportation Do you have the ability to pay for your medications:Yes,  patient will be provided with medication samples and prescriptions at discharge  Release of information consent forms completed and in the chart;  Patient's signature needed at discharge.  Patient to Follow up at: Follow-up Information    [facility].   Specialty:  Professional Counselor   Why:  Therapy appointment on Tuesday Dec. 22 at 4 pm with Magda PaganiniAudrey. Please call office if you need to reschedule appointment.   Contact information:   [facility] [address] [place] [address] [phone]       Patient denies SI/HI:   Yes,  denies    Safety Planning and Suicide Prevention discussed:  Yes,  with patient and mother  Patient refused referral  Rula [place], [place]Kristin L 12/26/2013, 8:25 AM

## 2013-12-26 NOTE — Progress Notes (Signed)
D:  Patient denied SI and HI, contracts for safety.  Denied A/V hallucinations.  Denied pain. A:  Medications administered per MD orders.  Emotional support and encouragement given patient. R:  Safety maintained with 15 minute checks.  

## 2013-12-26 NOTE — [facility] Group Notes (Signed)
The focus of this group is to educate the patient on the purpose and policies of crisis stabilization and provide a format to answer questions about their admission.  The group details unit policies and expectations of patients while admitted.  Patient attended 0900 nurse education orientation group this morning.  Patient actively participated, appropriate affect, alert, appropriate insight and engagement.  Today patient will work on 3 goals for discharge.  

## 2013-12-26 NOTE — Progress Notes (Signed)
D: Pt presents blunted in affect and depressed in mood. Pt actively participates within the milieu. Pt is negative for any SI/HI/AVH. Pt was minimal in interaction with this Clinical research associatewriter.  A: Writer administered scheduled and prn medications to pt, per MD orders. Continued support and availability as needed was extended to this pt. Staff continue to monitor pt with q5215min checks.  R: No adverse drug reactions noted. Pt receptive to treatment. Pt remains safe at this time.

## 2013-12-26 NOTE — [facility] Group Notes (Signed)
[facility] LCSW Group Therapy 12/26/2013  1:15 PM   Type of Therapy: Group Therapy  Participation Level: Did Not Attend. Patient invited to attend but declined.   Tanda Morrissey, MSW, LCSWA Clinical Social Worker [facility] [phone]   

## 2013-12-26 NOTE — Progress Notes (Signed)
Patient ID: Katelyn HippoJennifer Brooke Bridges, female   DOB: 07/19/1980, 33 y.o.   MRN: [id number]  Belongings returned to patient at time of discharge. Discharge instructions and medications were reviewed with patient. Patient verbalized understanding of both medications and discharge instructions. Patient discharged to lobby. Q15 minute safety checks maintained until discharge. No distress noted upon discharge.

## 2013-12-26 NOTE — Clinical Social Work Note (Signed)
CSW met with patient to discuss discharge home today. Patient was asleep but easily awoke. Patient reports feeling safe for discharge and states that she needs to call her parents regarding her transportation. CSW informed patient that letter for work was placed on chart. Patient thanked CSW for assistance, has no further questions.  CSW updated RN that patient needs to call parents regarding arranging her transportation.  Tilden Fossa, MSW, [place] Worker [facility] [phone]

## 2013-12-26 NOTE — Clinical Social Work Note (Signed)
Letter for work placed on patient's chart.  Katelyn BruinKristin Marc Sivertsen, MSW, [employer]CSWA Clinical Social Worker [facility] [phone]

## 2013-12-30 NOTE — Progress Notes (Signed)
Patient Discharge Instructions:  After Visit Summary (AVS):   Faxed to:  12/30/13 Discharge Summary Note:   Faxed to:  12/30/13 Psychiatric Admission Assessment Note:   Faxed to:  12/30/13 Suicide Risk Assessment - Discharge Assessment:   Faxed to:  12/30/13 Faxed/Sent to the Next Level Care provider:  12/30/13 [facility] to [facility] @ [phone] Jerelene ReddenSheena E [place], 12/30/2013, 3:53 PM

## 2015-12-17 ENCOUNTER — Encounter (HOSPITAL_COMMUNITY): Payer: Self-pay | Admitting: Behavioral Health

## 2016-03-18 ENCOUNTER — Emergency Department (HOSPITAL_COMMUNITY): Payer: Self-pay

## 2016-03-18 ENCOUNTER — Encounter (HOSPITAL_COMMUNITY): Payer: Self-pay | Admitting: Emergency Medicine

## 2016-03-18 ENCOUNTER — Emergency Department (HOSPITAL_COMMUNITY)
Admission: EM | Admit: 2016-03-18 | Discharge: 2016-03-19 | Disposition: A | Discharge status: Home / Self Care | Payer: Self-pay | Attending: Emergency Medicine | Admitting: Emergency Medicine

## 2016-03-18 DIAGNOSIS — F17210 Nicotine dependence, cigarettes, uncomplicated: Secondary | ICD-10-CM | POA: Insufficient documentation

## 2016-03-18 DIAGNOSIS — R109 Unspecified abdominal pain: Secondary | ICD-10-CM | POA: Insufficient documentation

## 2016-03-18 DIAGNOSIS — Z79899 Other long term (current) drug therapy: Secondary | ICD-10-CM | POA: Insufficient documentation

## 2016-03-18 LAB — URINALYSIS, ROUTINE W REFLEX MICROSCOPIC
Bilirubin Urine: NEGATIVE
Glucose, UA: NEGATIVE mg/dL
Ketones, ur: 5 mg/dL — AB
LEUKOCYTES UA: NEGATIVE
Nitrite: NEGATIVE
PROTEIN: NEGATIVE mg/dL
SPECIFIC GRAVITY, URINE: 1.023 (ref 1.005–1.030)
pH: 5 (ref 5.0–8.0)

## 2016-03-18 LAB — POC URINE PREG, ED: PREG TEST UR: NEGATIVE

## 2016-03-18 MED ORDER — CIPROFLOXACIN HCL 500 MG PO TABS: 500.0000 mg | ORAL_TABLET | Freq: Two times a day (BID) | ORAL | 0 refills | Status: AC

## 2016-03-18 MED ORDER — HYDROCODONE-ACETAMINOPHEN 5-325 MG PO TABS: 1.0000 | ORAL_TABLET | Freq: Four times a day (QID) | ORAL | 0 refills | Status: AC | PRN

## 2016-03-18 NOTE — ED Provider Notes (Signed)
[facility]-EMERGENCY DEPT Provider Note   CSN: [id number] Arrival date & time: 03/18/16  1824     History   Chief Complaint Chief Complaint  Patient presents with  . Flank Pain  . Dysuria    HPI Katelyn Bridges is a 36 y.o. female.   Flank Pain  This is a new problem. The current episode started 2 days ago. The problem occurs constantly. The problem has not changed since onset.Associated symptoms include abdominal pain. Nothing aggravates the symptoms. Nothing relieves the symptoms. She has tried nothing for the symptoms.    Past Medical History:  Diagnosis Date  . ANXIETY 05/20/2009  . DEPRESSION 05/20/2009  . DEPRESSION/ANXIETY 03/24/2008  . INSOMNIA-SLEEP DISORDER-UNSPEC 03/24/2008  . PSORIASIS 03/24/2008  . Seizure (HCC)   . SKIN RASH 08/13/2009    Patient Active Problem List   Diagnosis Date Noted  . Alcohol dependence with alcohol-induced mood disorder (HCC)   . Polysubstance dependence including opioid type drug, episodic abuse (HCC) 12/18/2013  . Alcohol use disorder, severe, dependence (HCC)   . Major depressive disorder, recurrent, severe without psychotic features (HCC)   . Alcohol dependence (HCC) 09/30/2013  . Mood disorder (HCC) 09/30/2013  . Diarrhea 06/15/2010  . Preventative health care 06/14/2010  . SKIN RASH 08/13/2009  . ANXIETY 05/20/2009  . PSORIASIS 03/24/2008  . INSOMNIA-SLEEP DISORDER-UNSPEC 03/24/2008    Past Surgical History:  Procedure Laterality Date  . OVARIAN CYST REMOVAL    . POLYPECTOMY    . s/p uterine polyp  2010    OB History    No data available       Home Medications    Prior to Admission medications   Medication Sig Start Date End Date Taking? Authorizing Provider  AZO-CRANBERRY PO Take 1 tablet by mouth 2 (two) times daily.   Yes Historical Provider, MD  ibuprofen (ADVIL,MOTRIN) 200 MG tablet Take 400-600 mg by mouth every 6 (six) hours as needed for moderate pain.   Yes Historical Provider, MD  ciprofloxacin (CIPRO)  500 MG tablet Take 1 tablet (500 mg total) by mouth 2 (two) times daily. 03/18/16   Marily Memos, MD  HYDROcodone-acetaminophen (NORCO/VICODIN) 5-325 MG tablet Take 1 tablet by mouth every 6 (six) hours as needed for severe pain. 03/18/16   Marily Memos, MD  sertraline (ZOLOFT) 100 MG tablet Take 1 tablet (100 mg total) by mouth daily. For depression Patient not taking: Reported on 03/18/2016 12/26/13   Sanjuana Kava, NP    Family History Family History  Problem Relation Age of Onset  . Diabetes Mother   . Anxiety disorder Mother   . Depression Father   . Diabetes Other     Social History Social History  Substance Use Topics  . Smoking status: Current Every Day Smoker    Packs/day: 0.50    Types: Cigarettes  . Smokeless tobacco: Never Used  . Alcohol use 4.8 oz/week    5 Glasses of wine, 2 Cans of beer, 1 Shots of liquor per week     Comment: daily drinker     Allergies   Patient has no known allergies.   Review of Systems Review of Systems  Gastrointestinal: Positive for abdominal pain.  Genitourinary: Positive for dysuria and flank pain.  All other systems reviewed and are negative.    Physical Exam Updated Vital Signs BP 115/75   Pulse 71   Temp 98.8 F (37.1 C) (Oral)   Resp 14   Ht 5\' 5"  (1.651 m)   Wt 160 lb (  72.6 kg)   LMP 03/01/2016   SpO2 100%   BMI 26.63 kg/m   Physical Exam  Constitutional: She is oriented to person, place, and time. She appears well-developed and well-nourished.  HENT:  Head: Normocephalic and atraumatic.  Eyes: Conjunctivae and EOM are normal.  Neck: Normal range of motion.  Cardiovascular: Normal rate and regular rhythm.   Pulmonary/Chest: Effort normal. No stridor. No respiratory distress.  Abdominal: Soft. She exhibits no distension. There is no tenderness. There is no guarding.  Musculoskeletal: She exhibits no edema or deformity.  Neurological: She is alert and oriented to person, place, and time. No cranial nerve deficit.    Nursing note and vitals reviewed.    ED Treatments / Results  Labs (all labs ordered are listed, but only abnormal results are displayed) Labs Reviewed  URINALYSIS, ROUTINE W REFLEX MICROSCOPIC - Abnormal; Notable for the following:       Result Value   APPearance HAZY (*)    Hgb urine dipstick SMALL (*)    Ketones, ur 5 (*)    Bacteria, UA RARE (*)    Squamous Epithelial / LPF 0-5 (*)    All other components within normal limits  POC URINE PREG, ED    EKG  EKG Interpretation None       Radiology Ct Renal Stone Study  Result Date: 03/18/2016 CLINICAL DATA:  Left flank pain for several days.  Dysuria. EXAM: CT ABDOMEN AND PELVIS WITHOUT CONTRAST TECHNIQUE: Multidetector CT imaging of the abdomen and pelvis was performed following the standard protocol without IV contrast. COMPARISON:  06/11/2011 FINDINGS: Lower chest: Stable linear scarring in the right base. No acute findings. Hepatobiliary: No focal liver abnormality is seen. No gallstones, gallbladder wall thickening, or biliary dilatation. Pancreas: Unremarkable. No pancreatic ductal dilatation or surrounding inflammatory changes. Spleen: Normal in size without focal abnormality. Adrenals/Urinary Tract: Adrenal glands are unremarkable. Kidneys are normal, without renal calculi, focal lesion, or hydronephrosis. Bladder is unremarkable. Stomach/Bowel: Stomach is within normal limits. Appendix is normal. No evidence of bowel wall thickening, distention, or inflammatory changes. Vascular/Lymphatic: No significant vascular findings are present. No enlarged abdominal or pelvic lymph nodes. Reproductive: Uterus and bilateral adnexa are unremarkable. Other: No inflammatory changes.  No ascites. Musculoskeletal: No significant skeletal lesions. IMPRESSION: No significant abnormality. Electronically Signed   By: Ellery Plunkaniel R Mitchell M.D.   On: 03/18/2016 23:32    Procedures Procedures (including critical care time)  Medications Ordered in  ED Medications - No data to display   Initial Impression / Assessment and Plan / ED Course  I have reviewed the triage vital signs and the nursing notes.  Pertinent labs & imaging results that were available during my care of the patient were reviewed by me and considered in my medical decision making (see chart for details).     Workup negative. Possibly partially treated UTI. Possibly MSK. Will treat accordingly. pcp follow up in 3 days if not improving.   Final Clinical Impressions(s) / ED Diagnoses   Final diagnoses:  Left flank pain    New Prescriptions Discharge Medication List as of 03/18/2016 11:53 PM    START taking these medications   Details  HYDROcodone-acetaminophen (NORCO/VICODIN) 5-325 MG tablet Take 1 tablet by mouth every 6 (six) hours as needed for severe pain., Starting Fri 03/18/2016, Print         Marily MemosJason Poppi Scantling, MD 03/19/16 [phone]

## 2016-03-18 NOTE — ED Notes (Signed)
Dr in with Pt delay in vitals, Pt now in CT further delay in vitals.

## 2016-03-18 NOTE — ED Triage Notes (Signed)
Patient states that she thought had another UTI so was taking AZO OTC and some old Cipro she had but symptoms arent getting better and having left flank pain over the past couple days. patient denies any hematuria just dysuria
